# Patient Record
Sex: Male | Born: 1955 | Race: White | Hispanic: No | Smoking: Never smoker
Health system: Southern US, Community
[De-identification: ages and names within clinical notes are randomized; demographics above are authoritative.]

## PROBLEM LIST (undated history)

## (undated) DIAGNOSIS — E663 Overweight: Secondary | ICD-10-CM

## (undated) DIAGNOSIS — I491 Atrial premature depolarization: Secondary | ICD-10-CM

## (undated) DIAGNOSIS — G609 Hereditary and idiopathic neuropathy, unspecified: Secondary | ICD-10-CM

## (undated) DIAGNOSIS — I10 Essential (primary) hypertension: Secondary | ICD-10-CM

## (undated) DIAGNOSIS — R7309 Other abnormal glucose: Secondary | ICD-10-CM

## (undated) DIAGNOSIS — E785 Hyperlipidemia, unspecified: Secondary | ICD-10-CM

## (undated) DIAGNOSIS — K5790 Diverticulosis of intestine, part unspecified, without perforation or abscess without bleeding: Secondary | ICD-10-CM

## (undated) DIAGNOSIS — Z87442 Personal history of urinary calculi: Secondary | ICD-10-CM

## (undated) DIAGNOSIS — G43009 Migraine without aura, not intractable, without status migrainosus: Secondary | ICD-10-CM

## (undated) DIAGNOSIS — E119 Type 2 diabetes mellitus without complications: Secondary | ICD-10-CM

## (undated) HISTORY — DX: Atrial premature depolarization: I49.1

## (undated) HISTORY — PX: TRACHEOSTOMY: SUR1362

## (undated) HISTORY — DX: Overweight: E66.3

## (undated) HISTORY — DX: Hereditary and idiopathic neuropathy, unspecified: G60.9

## (undated) HISTORY — DX: Hyperlipidemia, unspecified: E78.5

## (undated) HISTORY — DX: Personal history of urinary calculi: Z87.442

## (undated) HISTORY — DX: Other abnormal glucose: R73.09

## (undated) HISTORY — DX: Diverticulosis of intestine, part unspecified, without perforation or abscess without bleeding: K57.90

## (undated) HISTORY — DX: Essential (primary) hypertension: I10

## (undated) HISTORY — DX: Migraine without aura, not intractable, without status migrainosus: G43.009

## (undated) HISTORY — PX: TONSILLECTOMY: SUR1361

---

## 2002-05-28 ENCOUNTER — Emergency Department (HOSPITAL_COMMUNITY): Admission: EM | Admit: 2002-05-28 | Discharge: 2002-05-29 | Payer: Self-pay | Admitting: Emergency Medicine

## 2002-05-29 ENCOUNTER — Encounter: Payer: Self-pay | Admitting: Emergency Medicine

## 2004-04-06 ENCOUNTER — Ambulatory Visit: Payer: Self-pay | Admitting: Internal Medicine

## 2004-04-12 ENCOUNTER — Ambulatory Visit: Payer: Self-pay

## 2004-08-10 ENCOUNTER — Ambulatory Visit: Payer: Self-pay | Admitting: Internal Medicine

## 2004-09-18 ENCOUNTER — Ambulatory Visit: Payer: Self-pay | Admitting: Internal Medicine

## 2005-01-28 LAB — HM COLONOSCOPY

## 2005-09-23 ENCOUNTER — Ambulatory Visit: Payer: Self-pay | Admitting: Internal Medicine

## 2005-11-04 ENCOUNTER — Ambulatory Visit: Payer: Self-pay | Admitting: Internal Medicine

## 2006-07-18 ENCOUNTER — Ambulatory Visit: Payer: Self-pay | Admitting: Gastroenterology

## 2006-08-10 ENCOUNTER — Encounter: Payer: Self-pay | Admitting: Internal Medicine

## 2006-08-28 ENCOUNTER — Ambulatory Visit: Payer: Self-pay | Admitting: Gastroenterology

## 2006-11-04 ENCOUNTER — Ambulatory Visit: Payer: Self-pay | Admitting: Internal Medicine

## 2006-11-04 LAB — CONVERTED CEMR LAB
ALT: 21 units/L (ref 0–53)
AST: 23 units/L (ref 0–37)
Albumin: 3.9 g/dL (ref 3.5–5.2)
Alkaline Phosphatase: 77 units/L (ref 39–117)
BUN: 17 mg/dL (ref 6–23)
Basophils Absolute: 0 10*3/uL (ref 0.0–0.1)
Basophils Relative: 0.4 % (ref 0.0–1.0)
Bilirubin Urine: NEGATIVE
Bilirubin, Direct: 0.1 mg/dL (ref 0.0–0.3)
Blood in Urine, dipstick: NEGATIVE
CO2: 31 meq/L (ref 19–32)
Calcium: 9.5 mg/dL (ref 8.4–10.5)
Chloride: 105 meq/L (ref 96–112)
Cholesterol: 191 mg/dL (ref 0–200)
Creatinine, Ser: 1.1 mg/dL (ref 0.4–1.5)
Eosinophils Absolute: 0.2 10*3/uL (ref 0.0–0.6)
Eosinophils Relative: 2.1 % (ref 0.0–5.0)
GFR calc Af Amer: 91 mL/min
GFR calc non Af Amer: 75 mL/min
Glucose, Bld: 105 mg/dL — ABNORMAL HIGH (ref 70–99)
Glucose, Urine, Semiquant: NEGATIVE
HCT: 42.3 % (ref 39.0–52.0)
HDL: 34.1 mg/dL — ABNORMAL LOW (ref >39.0)
Hemoglobin: 14.7 g/dL (ref 13.0–17.0)
Ketones, urine, test strip: NEGATIVE
LDL Cholesterol: 116 mg/dL — ABNORMAL HIGH (ref 0–99)
Lymphocytes Relative: 34.3 % (ref 12.0–46.0)
MCHC: 34.8 g/dL (ref 30.0–36.0)
MCV: 91.8 fL (ref 78.0–100.0)
Monocytes Absolute: 0.9 10*3/uL — ABNORMAL HIGH (ref 0.2–0.7)
Monocytes Relative: 10.9 % (ref 3.0–11.0)
Neutro Abs: 4.2 10*3/uL (ref 1.4–7.7)
Neutrophils Relative %: 52.3 % (ref 43.0–77.0)
Nitrite: NEGATIVE
PSA: 0.65 ng/mL (ref 0.10–4.00)
Platelets: 268 10*3/uL (ref 150–400)
Potassium: 4.6 meq/L (ref 3.5–5.1)
Protein, U semiquant: NEGATIVE
RBC: 4.6 M/uL (ref 4.22–5.81)
RDW: 13.3 % (ref 11.5–14.6)
Sodium: 141 meq/L (ref 135–145)
Specific Gravity, Urine: 1.02
TSH: 2.17 microintl units/mL (ref 0.35–5.50)
Total Bilirubin: 0.9 mg/dL (ref 0.3–1.2)
Total CHOL/HDL Ratio: 5.6
Total Protein: 6.7 g/dL (ref 6.0–8.3)
Triglycerides: 205 mg/dL (ref 0–149)
Urobilinogen, UA: 0.2
VLDL: 41 mg/dL — ABNORMAL HIGH (ref 0–40)
WBC: 8.1 10*3/uL (ref 4.5–10.5)
pH: 5

## 2006-11-11 ENCOUNTER — Ambulatory Visit: Payer: Self-pay | Admitting: Internal Medicine

## 2006-11-11 DIAGNOSIS — R7309 Other abnormal glucose: Secondary | ICD-10-CM

## 2006-11-11 DIAGNOSIS — Z87442 Personal history of urinary calculi: Secondary | ICD-10-CM

## 2006-11-11 DIAGNOSIS — M109 Gout, unspecified: Secondary | ICD-10-CM | POA: Insufficient documentation

## 2006-11-11 HISTORY — DX: Personal history of urinary calculi: Z87.442

## 2006-11-11 HISTORY — DX: Other abnormal glucose: R73.09

## 2006-11-26 ENCOUNTER — Encounter: Payer: Self-pay | Admitting: Internal Medicine

## 2006-11-26 ENCOUNTER — Ambulatory Visit: Payer: Self-pay

## 2006-11-28 ENCOUNTER — Ambulatory Visit: Payer: Self-pay

## 2006-12-04 ENCOUNTER — Telehealth (INDEPENDENT_AMBULATORY_CARE_PROVIDER_SITE_OTHER): Payer: Self-pay | Admitting: *Deleted

## 2006-12-12 ENCOUNTER — Encounter: Payer: Self-pay | Admitting: Cardiovascular Disease

## 2006-12-12 ENCOUNTER — Ambulatory Visit: Payer: Self-pay | Admitting: Cardiovascular Disease

## 2006-12-15 ENCOUNTER — Ambulatory Visit: Payer: Self-pay

## 2007-02-13 ENCOUNTER — Telehealth: Payer: Self-pay | Admitting: Internal Medicine

## 2008-04-27 ENCOUNTER — Ambulatory Visit: Payer: Self-pay | Admitting: Internal Medicine

## 2009-02-20 ENCOUNTER — Ambulatory Visit: Payer: Self-pay | Admitting: Internal Medicine

## 2009-02-20 LAB — CONVERTED CEMR LAB
Alkaline Phosphatase: 62 units/L (ref 39–117)
BUN: 23 mg/dL (ref 6–23)
Basophils Absolute: 0.1 10*3/uL (ref 0.0–0.1)
Bilirubin Urine: NEGATIVE
Bilirubin, Direct: 0 mg/dL (ref 0.0–0.3)
Blood in Urine, dipstick: NEGATIVE
CO2: 29 meq/L (ref 19–32)
Calcium: 9.1 mg/dL (ref 8.4–10.5)
Chloride: 108 meq/L (ref 96–112)
Cholesterol: 175 mg/dL (ref 0–200)
Creatinine, Ser: 1.3 mg/dL (ref 0.4–1.5)
Eosinophils Absolute: 0.3 10*3/uL (ref 0.0–0.7)
Glucose, Bld: 119 mg/dL — ABNORMAL HIGH (ref 70–99)
HDL: 39.3 mg/dL (ref >39.00)
Ketones, urine, test strip: NEGATIVE
Lymphocytes Relative: 31.5 % (ref 12.0–46.0)
MCHC: 32.2 g/dL (ref 30.0–36.0)
MCV: 95.3 fL (ref 78.0–100.0)
Monocytes Absolute: 0.9 10*3/uL (ref 0.1–1.0)
Neutrophils Relative %: 50.4 % (ref 43.0–77.0)
Nitrite: NEGATIVE
PSA: 0.77 ng/mL (ref 0.10–4.00)
Platelets: 221 10*3/uL (ref 150.0–400.0)
RDW: 13.2 % (ref 11.5–14.6)
Specific Gravity, Urine: 1.02
Total CHOL/HDL Ratio: 4
Total Protein: 7 g/dL (ref 6.0–8.3)
Triglycerides: 119 mg/dL (ref 0.0–149.0)
pH: 6

## 2009-02-27 ENCOUNTER — Ambulatory Visit: Payer: Self-pay | Admitting: Internal Medicine

## 2009-02-27 DIAGNOSIS — G43009 Migraine without aura, not intractable, without status migrainosus: Secondary | ICD-10-CM | POA: Insufficient documentation

## 2009-02-27 HISTORY — DX: Migraine without aura, not intractable, without status migrainosus: G43.009

## 2009-07-03 ENCOUNTER — Ambulatory Visit: Payer: Self-pay | Admitting: Internal Medicine

## 2009-07-03 LAB — CONVERTED CEMR LAB
ALT: 22 units/L (ref 0–53)
BUN: 22 mg/dL (ref 6–23)
Bilirubin, Direct: 0.1 mg/dL (ref 0.0–0.3)
CO2: 30 meq/L (ref 19–32)
Calcium: 9.5 mg/dL (ref 8.4–10.5)
Chloride: 105 meq/L (ref 96–112)
Cholesterol: 194 mg/dL (ref 0–200)
Creatinine, Ser: 1.1 mg/dL (ref 0.4–1.5)
Direct LDL: 121.5 mg/dL
Hgb A1c MFr Bld: 6.1 % (ref 4.6–6.5)
Total CHOL/HDL Ratio: 5
Total Protein: 7 g/dL (ref 6.0–8.3)
Triglycerides: 212 mg/dL — ABNORMAL HIGH (ref 0.0–149.0)
VLDL: 42.4 mg/dL — ABNORMAL HIGH (ref 0.0–40.0)

## 2009-07-21 ENCOUNTER — Telehealth: Payer: Self-pay | Admitting: Internal Medicine

## 2009-10-30 ENCOUNTER — Telehealth: Payer: Self-pay | Admitting: Internal Medicine

## 2009-11-01 ENCOUNTER — Telehealth: Payer: Self-pay | Admitting: Internal Medicine

## 2009-11-10 ENCOUNTER — Ambulatory Visit: Payer: Self-pay | Admitting: Internal Medicine

## 2009-11-10 DIAGNOSIS — G609 Hereditary and idiopathic neuropathy, unspecified: Secondary | ICD-10-CM | POA: Insufficient documentation

## 2009-11-10 HISTORY — DX: Hereditary and idiopathic neuropathy, unspecified: G60.9

## 2009-11-14 LAB — CONVERTED CEMR LAB
BUN: 17 mg/dL (ref 6–23)
CO2: 26 meq/L (ref 19–32)
Calcium: 9.8 mg/dL (ref 8.4–10.5)
Creatinine, Ser: 1 mg/dL (ref 0.4–1.5)
GFR calc non Af Amer: 79.97 mL/min (ref >60)
Glucose, Bld: 100 mg/dL — ABNORMAL HIGH (ref 70–99)
TSH: 2.79 microintl units/mL (ref 0.35–5.50)

## 2009-11-16 ENCOUNTER — Telehealth: Payer: Self-pay | Admitting: Internal Medicine

## 2009-11-27 ENCOUNTER — Encounter: Payer: Self-pay | Admitting: Internal Medicine

## 2009-12-05 ENCOUNTER — Encounter: Payer: Self-pay | Admitting: Internal Medicine

## 2009-12-26 ENCOUNTER — Telehealth: Payer: Self-pay | Admitting: Internal Medicine

## 2010-01-16 ENCOUNTER — Encounter: Payer: Self-pay | Admitting: Internal Medicine

## 2010-02-27 NOTE — Consult Note (Signed)
Summary: [facility]  [facility]   Imported By: Maryln Gottron 12/08/2009 14:43:11  _____________________________________________________________________  External Attachment:    Type:   Image     Comment:   External Document

## 2010-02-27 NOTE — Progress Notes (Signed)
Summary: Urology Referral  Phone Note Call from Patient Call back at Home Phone [phone] Call back at Work Phone [phone] Call back at [phone]   Summary of Call: Pt was referred to urologist several years ago for frequent kidney stones.  Has had 2 within the last month, has been self treating but really needs to see urologist, requesting referral. Initial call taken by: Trixie Dredge,  October 30, 2009 11:52 AM  Follow-up for Phone Call        ok Follow-up by: Birdie Sons MD,  October 30, 2009 1:51 PM  Additional Follow-up for Phone Call Additional follow up Details #1::        referral order sent to [facility]. Additional Follow-up by: Trixie Dredge,  October 30, 2009 3:14 PM

## 2010-02-27 NOTE — Assessment & Plan Note (Signed)
Summary: numbness underneath toes for a couple of months/cjr   Vital Signs:  Patient profile:   55 year old male Height:      75.5 inches (191.77 cm) Weight:      322 pounds (146.36 kg) Temp:     98.5 degrees F (36.94 degrees C) oral Pulse rate:   90 / minute BP sitting:   120 / 90  (left arm) Cuff size:   large  Vitals Entered By: Josph Macho RMA (November 10, 2009 8:58 AM) CC: Numbness underneath toes for a couple of weeks/ CF Is Patient Diabetic? No   CC:  Numbness underneath toes for a couple of weeks/ CF.  History of Present Illness: 2 month hx of unusual feeling feet bilaterally feels like "socks bunched up" "loss of sensation" no pain no associated sxs  no other specific complaints  Current Medications (verified): 1)  Allopurinol 300 Mg Tabs (Allopurinol) .... Take 1 Tablet By Mouth Once A Day 2)  Multivitamins  Tabs (Multiple Vitamin) .... Once Daily  Allergies (verified): No Known Drug Allergies  Review of Systems       Flu Vaccine Consent Questions     Do you have a history of severe allergic reactions to this vaccine? no    Any prior history of allergic reactions to egg and/or gelatin? no    Do you have a sensitivity to the preservative Thimersol? no    Do you have a past history of Guillan-Barre Syndrome? no    Do you currently have an acute febrile illness? no    Have you ever had a severe reaction to latex? no    Vaccine information given and explained to patient? yes    Are you currently pregnant? no    Lot Number:[id number]   Exp Date:07/28/2010   Site Given  Left Deltoid IM Josph Macho RMA  November 10, 2009 9:00 AM   Physical Exam  General:  overweight male in no acute distress. HEENT exam atraumatic, normocephalic. Neck is supple without lymphadenopathy or thyromegaly. Chest clear to auscultation cardiac exam S1-S2 are regular. Abdominal exam active bowel sounds, soft. Extremities without clubbing cyanosis or edema. Neurologic exam gait  is normal. Deep tendon reflexes are intact. Monofilament testing is normal over the feet and ankle and hands bilaterally.   Impression & Recommendations:  Problem # 1:  PERIPHERAL NEUROPATHY (ICD-356.9) by history. Unable to find any objective evidence of such. I'll check some routine laboratories. If these are normal and not sure if further workup is indicated. I did tell the patient if he becomes overly concerned I nerve conduction study may be diagnostic. Orders: Venipuncture (25427) TLB-TSH (Thyroid Stimulating Hormone) (84443-TSH) TLB-B12 + Folate Pnl (06237_62831-D17/OHY) TLB-BMP (Basic Metabolic Panel-BMET) (80048-METABOL) TLB-A1C / Hgb A1C (Glycohemoglobin) (83036-A1C)  Complete Medication List: 1)  Allopurinol 300 Mg Tabs (Allopurinol) .... Take 1 tablet by mouth once a day 2)  Multivitamins Tabs (Multiple vitamin) .... Once daily  Other Orders: Admin 1st Vaccine (07371) Flu Vaccine 56yrs + [phone])

## 2010-02-27 NOTE — Progress Notes (Signed)
Summary: nerve conduction  Phone Note Call from Patient Call back at Home Phone [phone]   Caller: Patient Call For: Birdie Sons MD Summary of Call: Needs referral from [place] for neurology.  Also, need nerve conduction study results. Initial call taken by: [facility] CMA AAMA,  December 26, 2009 12:06 PM  Follow-up for Phone Call        ok to refer Follow-up by: Birdie Sons MD,  December 26, 2009 3:05 PM  Additional Follow-up for Phone Call Additional follow up Details #1::        Notified pt. Additional Follow-up by: Lynann Beaver CMA AAMA,  December 26, 2009 3:13 PM

## 2010-02-27 NOTE — Progress Notes (Signed)
Summary: yeast infection?  Phone Note Call from Patient   Caller: Patient Call For: Birdie Sons MD Summary of Call: Pt. states he has a yeast infection in the groin area, and was treated by Dr. Cato Mulligan before with a powder and an ointment.  [facility]).  [phone] Initial call taken by: Lynann Beaver CMA,  November 01, 2009 11:42 AM  Follow-up for Phone Call        see rx Follow-up by: Birdie Sons MD,  November 01, 2009 12:34 PM  Additional Follow-up for Phone Call Additional follow up Details #1::        Left message on personal voice mail to pick up prescriptions. Additional Follow-up by: Lynann Beaver CMA,  November 01, 2009 12:37 PM    New/Updated Medications: NYSTATIN-TRIAMCINOLONE 100000-0.1 UNIT/GM-% CREA (NYSTATIN-TRIAMCINOLONE) apply two times a day to affected area Prescriptions: NYSTATIN-TRIAMCINOLONE 100000-0.1 UNIT/GM-% CREA (NYSTATIN-TRIAMCINOLONE) apply two times a day to affected area  #30 grams x 0   Entered and Authorized by:   Birdie Sons MD   Signed by:   Birdie Sons MD on 11/01/2009   Method used:   Electronically to        [facility] (retail)       [address]       [place], [place]  [address]       Ph: [phone]       Fax: [phone]   RxID:   [id number]

## 2010-02-27 NOTE — Progress Notes (Signed)
  Phone Note Call from Patient Call back at Home Phone [phone] Call back at Work Phone [phone]   Summary of Call: [phone] Nicholas Macdonald what would be the next step in diagnosis of his feet numbness. Initial call taken by: Lynann Beaver CMA AAMA,  November 16, 2009 3:36 PM  Follow-up for Phone Call        next step is nerve conduction study---would document neuropathy ok to schedule Follow-up by: Birdie Sons MD,  November 16, 2009 10:09 PM

## 2010-02-27 NOTE — Procedures (Signed)
Summary: Colonoscopy  Colonoscopy   Imported By: Cala Bradford Mesiemore 02/27/2009 11:14:21  _____________________________________________________________________  External Attachment:    Type:   Image     Comment:   External Document

## 2010-02-27 NOTE — Progress Notes (Signed)
Summary: lab results  Phone Note Call from Patient Call back at [phone]   Caller: Patient--live call Summary of Call: Reqesting lab results from a few weeks ago. Initial call taken by: Warnell Forester,  July 21, 2009 9:45 AM  Follow-up for Phone Call        Left results on personal voice mail. Follow-up by: Gladis Riffle, RN,  July 21, 2009 11:58 AM

## 2010-02-27 NOTE — Assessment & Plan Note (Signed)
Summary: CPX // RS   Vital Signs:  Patient profile:   55 year old male Height:      75.5 inches Weight:      323 pounds BMI:     39.98 Pulse rate:   86 / minute Resp:     12 per minute BP sitting:   120 / 86  (left arm) Cuff size:   large  Vitals Entered By: Gladis Riffle, RN (February 27, 2009 10:07 AM) CC: cpx, labs done Is Patient Diabetic? No   CC:  cpx and labs done.  History of Present Illness: cpx  new complaints: pt describes moderate lack of sensation toes of left foot  migraine headache with typical visual aura. ongoing and unchanged for years. frequency --approx monthly, intensit 4-7/10.  no other associated sxs  Preventive Screening-Counseling & Management  Alcohol-Tobacco     Smoking Status: never  Current Problems (verified): 1)  Hyperglycemia  (ICD-790.29) 2)  Preventive Health Care  (ICD-V70.0) 3)  Nephrolithiasis, Hx of  (ICD-V13.01) 4)  Gout  (ICD-274.9)  Current Medications (verified): 1)  Allopurinol 300 Mg Tabs (Allopurinol) .... Take 1 Tablet By Mouth Once A Day 2)  Multivitamins  Tabs (Multiple Vitamin) .... Once Daily  Allergies (verified): No Known Drug Allergies  Comments:  Nurse/Medical Assistant: cpx, labs done--needs Rx   The patient's medications and allergies were reviewed with the patient and were updated in the Medication and Allergy Lists. Gladis Riffle, RN (February 27, 2009 10:08 AM)  Past History:  Past Medical History: Last updated: 11/11/2006 Gout Nephrolithiasis, hx of [facility]-dr Lupton. 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  normal blood pressure Head:  normocephalic and atraumatic.   Eyes:  pupils equal and pupils round.   Ears:  R ear normal and L ear normal.   Nose:  no external deformity and no external erythema.   Neck:  No deformities, masses, or tenderness noted. Chest Wall:  No deformities, masses, tenderness or gynecomastia noted. Lungs:  Normal respiratory effort, chest expands symmetrically. Lungs are clear to auscultation, no crackles or wheezes. Heart:  normal rate and regular rhythm.   Abdomen:  soft and non-tender.    overweight Rectal:  no external abnormalities and no hemorrhoids.   Prostate:  no nodules and no asymmetry.   Msk:  No deformity or scoliosis noted of thoracic or lumbar spine.   Pulses:  R radial normal and L radial normal.   Neurologic:  cranial nerves II-XII intact and gait normal.   Skin:  turgor normal and color normal.   Cervical Nodes:  no anterior cervical adenopathy and no posterior cervical adenopathy.   Axillary Nodes:  no R axillary adenopathy and no L axillary adenopathy.   Psych:  normally interactive and good eye contact.     Impression & Recommendations:  Problem # 1:  PREVENTIVE HEALTH CARE (ICD-V70.0) health maint utd disccussed need for aggressive weight loss daily exercise and low calorie diet  Problem # 2:  GOUT (ICD-274.9)  no recurrence His updated medication list for this problem includes:    Allopurinol 300 Mg Tabs (Allopurinol) .Marland Kitchen... Take 1 tablet by mouth once a day  Problem # 3:  MIGRAINE, COMMON (ICD-346.10) this is a long standing problem but has nto been addressed with triptan---discussed trial of as needed meds side effects discussed His updated medication list for this problem includes:    Sumatriptan Succinate 100 Mg Tabs  (Sumatriptan succinate) .Marland Kitchen... 1 by mouth at onset of headache. may repeat in one hour  Complete Medication List: 1)  Allopurinol 300 Mg Tabs (Allopurinol) .... Take 1 tablet by mouth once a day 2)  Multivitamins Tabs (Multiple vitamin) .... Once daily 3)  Sumatriptan Succinate 100 Mg Tabs (Sumatriptan succinate) .Marland Kitchen.. 1 by mouth at onset of headache. may repeat in one hour  Other Orders: Tdap => 62yrs IM (25366) Admin 1st Vaccine (44034)  Patient Instructions: 1)  4 months labs only 2)  labs one week prior to visit 3)  lipids---272.4 4)  lfts-995.2 5)  bmet-995.2 6)  A1C-250.02 7)     Prescriptions: ALLOPURINOL 300 MG TABS (ALLOPURINOL) Take 1 tablet by mouth once a day  #180 Each x 1   Entered and Authorized by:   Birdie Sons MD   Signed by:   Birdie Sons MD on 02/27/2009   Method used:   Electronically to        [facility] (retail)       [address]       [place], [place]  [address]       Ph: [phone]       Fax: [phone]   RxID:   [id number] SUMATRIPTAN SUCCINATE 100 MG TABS (SUMATRIPTAN SUCCINATE) 1 by mouth at onset of headache. may repeat in one hour  #9 x 3   Entered and Authorized by:   Birdie Sons MD   Signed by:   Birdie Sons MD on 02/27/2009   Method used:   Electronically to        [facility] (retail)       [address]       [place], [place]  [address]       Ph: [phone]       Fax: [phone]   RxID:   [id number]    Immunization History:  Influenza Immunization History:    Influenza:  historical (02/07/2009)  Immunizations Administered:  Tetanus Vaccine:    Vaccine Type: Tdap    Site: left deltoid    Mfr: GlaxoSmithKline    Dose: 0.5 ml    Route: IM    Given by: Gladis Riffle, RN    Exp. Date: 11/23/2009    Lot #: [id number]    Preventive Care Screening  Colonoscopy:    Next Due:  01/2015

## 2010-03-01 NOTE — Letter (Signed)
Summary: [facility]  [facility]   Imported By: Maryln Gottron 01/24/2010 09:40:43  _____________________________________________________________________  External Attachment:    Type:   Image     Comment:   External Document

## 2010-03-08 ENCOUNTER — Telehealth: Payer: Self-pay | Admitting: Internal Medicine

## 2010-03-08 DIAGNOSIS — M109 Gout, unspecified: Secondary | ICD-10-CM

## 2010-03-08 MED ORDER — ALLOPURINOL 300 MG PO TABS: 300.0000 mg | ORAL_TABLET | Freq: Every day | ORAL | Status: DC

## 2010-03-08 NOTE — Telephone Encounter (Signed)
Needs rx refill of Allopurinol 300MG  set to [facility] on [address] [phone]

## 2010-03-12 ENCOUNTER — Telehealth: Payer: Self-pay | Admitting: Internal Medicine

## 2010-03-12 DIAGNOSIS — M109 Gout, unspecified: Secondary | ICD-10-CM

## 2010-03-12 MED ORDER — ALLOPURINOL 300 MG PO TABS: 300.0000 mg | ORAL_TABLET | Freq: Every day | ORAL | Status: DC

## 2010-03-12 NOTE — Telephone Encounter (Signed)
Pt called and has sch a fasting cpx with labs on 04/06/10 at 8am. Pt is req his full refill for Allopurinol to [facility] on [address].

## 2010-03-27 ENCOUNTER — Encounter: Payer: Self-pay | Admitting: Internal Medicine

## 2010-03-27 ENCOUNTER — Ambulatory Visit (INDEPENDENT_AMBULATORY_CARE_PROVIDER_SITE_OTHER): Payer: BC Managed Care – PPO | Admitting: Internal Medicine

## 2010-03-27 VITALS — BP 124/84 | HR 80 | Temp 99.2°F | Ht 76.0 in | Wt 321.0 lb

## 2010-03-27 DIAGNOSIS — G609 Hereditary and idiopathic neuropathy, unspecified: Secondary | ICD-10-CM

## 2010-03-27 DIAGNOSIS — M109 Gout, unspecified: Secondary | ICD-10-CM

## 2010-03-27 MED ORDER — ALLOPURINOL 300 MG PO TABS: 300.0000 mg | ORAL_TABLET | Freq: Every day | ORAL | Status: DC

## 2010-03-27 NOTE — Progress Notes (Signed)
  Subjective:    Patient ID: Nicholas Macdonald, male    DOB: 05/11/1955, 55 y.o.   MRN: [id number]  HPI   patient comes in for gout. He has not had any recurrence over the past several years while on allopurinol. He has never tried to discontinue the allopurinol. He feels well has no complaints. No known side effects.  Past Medical History  Diagnosis Date  . Gout   . NEPHROLITHIASIS, HX OF 11/11/2006  . HYPERGLYCEMIA 11/11/2006  . PERIPHERAL NEUROPATHY 11/10/2009  . MIGRAINE, COMMON 02/27/2009   Past Surgical History  Procedure Date  . Tonsillectomy   . Tracheostomy     croup    reports that he has never smoked. He does not have any smokeless tobacco history on file. His alcohol and drug histories not on file. family history includes Atrial fibrillation in his father.     No Known Allergies   Review of Systems  patient denies chest pain, shortness of breath, orthopnea. Denies lower extremity edema, abdominal pain, change in appetite, change in bowel movements. Patient denies rashes, musculoskeletal complaints. No other specific complaints in a complete review of systems.      Objective:   Physical Exam  well-developed well-nourished male in no acute distress. HEENT exam atraumatic, normocephalic, neck supple without jugular venous distention. Chest clear to auscultation cardiac exam S1-S2 are regular. Abdominal exam overweight with bowel sounds, soft and nontender. Extremities no edema. Neurologic exam is alert with a normal gait.        Assessment & Plan:

## 2010-03-27 NOTE — Assessment & Plan Note (Signed)
Currents. Reasonable to continue allopurinol. I will refill the prescription. I did discuss with him the possibility that he could decreased Present dose of allopurinol. He will consider decreasing to every other day or half a pill qd

## 2010-04-06 ENCOUNTER — Encounter: Payer: Self-pay | Admitting: Internal Medicine

## 2010-06-12 NOTE — Assessment & Plan Note (Signed)
[facility]                            CARDIOLOGY OFFICE NOTE   NAME:MAHONEYMarland Kitchen SHOLOM DULUDE                    MRN:          [id number]  DATE:12/12/2006                            DOB:          03/29/1955    Mr. Stoiber is seen today as a new consult per Dr. Cato Mulligan.   He has had an abnormal Myoview.  He has been having sensations in his  chest.  In talking to the patient, he gets a surge in his chest that  has been going on for a year.  When he saw Dr. Cato Mulligan for his physical,  he had them for 3 days in a row.  The symptoms are atypical.  They do  not sound like angina.  They actually sound more like he is getting an  occasional flip flop or palpitation.  They sound like benign PACs or  PVCs.  He does not have long runs of palpitations.  These surges are  really not associated with dyspnea.  They can give him a sensation of  pressure in his chest.  There is no associated GI symptoms, no belching.  The patient has not had any vertigo or dizziness.  He has not had a  previous cardiac workup before.   He had a Myoview.  He exercised for 8 minutes and stopped due to  fatigue.  There were no significant EKG changes.  His Myoview showed no  evidence of ischemia but there was a question of abnormal septal motion  and the EF was quantitated at 43%.  I have reviewed his study and felt  it was low risk and I suspect that the EF is wrong.  I discussed this  with the patient.   REVIEW OF SYSTEMS:  Otherwise negative.   PAST MEDICAL HISTORY:  Fairly benign.  1. He has had a previous tracheostomy as a child for croup.  2. He has a question of gout.  3. He denies any allergies.   MEDICATIONS:  Multivitamin and occasionally he takes allopurinol.   FAMILY HISTORY:  Negative for premature coronary disease.   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  He travels  quite a bit, more by car now.  He does not smoke or drink.  He has 2  teenage boys and  is divorced.  He is fairly sedentary.   PHYSICAL EXAMINATION:  GENERAL:  Significant for an overweight white  male in no distress.  VITAL SIGNS:  Weight is 307, affect is appropriate, blood pressure is  118/78, pulse is 80 and regular, afebrile.  HEENT:  Unremarkable.  Carotids normal without bruit.  No  lymphadenopathy, thyromegaly, or JVP elevation.  LUNGS:  Clear with good diaphragmatic motion.  No wheezing.  HEART:  S1 S2 with normal heart sounds.  PMI is not palpable.  ABDOMEN:  Benign.  Bowel sounds positive.  No hepatosplenomegaly,  hepatojugular reflux.  No tenderness.  No bruits.  EXTREMITIES:  Distal pulses are intact.  No edema.  NEUROLOGIC:  Nonfocal.  SKIN:  Warm and dry.   His baseline EKG is normal.   IMPRESSION:  1. Episode of surges.  These sound like benign palpitations.  They      are currently more quiescent.  I told him if they were to recur to      call us and we would give him an event monitor.  Observation for      now.  2. Abnormal Myoview with septal abnormality.  Ejection fraction 43%.      Again, his perfusion images look normal and he had no EKG changes      with exercise.  We will do a 2D echocardiogram.  If his      echocardiogram is normal, he will not need further workup.  If his      ejection fraction is depressed, he will need further workup for a      possible nonischemic cardiomyopathy.  3. Risk factor modification per Dr. Cato Mulligan.  His blood pressure seems      to be under control.  He needs a lipid profile, which I will leave      up to Dr. Cato Mulligan to follow up.  4. History of tracheostomy, currently no wheezing, secondary to croup.      No evidence of active lung disease as an adult.  No need for a flu      shot at this time.  5. History of gout.  Continue allopurinol.  Follow up uric acid level.   Overall, it was a pleasure to meet Mr. Collard and as long as his  echocardiogram shows good LV function, he will follow up p.r.n.     Noralyn Pick. Eden Emms, MD, [facility]  Electronically Signed    PCN/MedQ  DD: 12/12/2006  DT: 12/12/2006  Job #: [id number]

## 2010-11-05 ENCOUNTER — Other Ambulatory Visit (INDEPENDENT_AMBULATORY_CARE_PROVIDER_SITE_OTHER): Payer: BC Managed Care – PPO

## 2010-11-05 DIAGNOSIS — Z23 Encounter for immunization: Secondary | ICD-10-CM

## 2010-11-05 DIAGNOSIS — Z0000 Encounter for general adult medical examination without abnormal findings: Secondary | ICD-10-CM

## 2010-11-05 LAB — HEPATIC FUNCTION PANEL
ALT: 23 U/L (ref 0–53)
AST: 28 U/L (ref 0–37)
Albumin: 3.9 g/dL (ref 3.5–5.2)
Alkaline Phosphatase: 72 U/L (ref 39–117)
Bilirubin, Direct: 0.1 mg/dL (ref 0.0–0.3)
Total Bilirubin: 0.6 mg/dL (ref 0.3–1.2)
Total Protein: 6.8 g/dL (ref 6.0–8.3)

## 2010-11-05 LAB — POCT URINALYSIS DIPSTICK
Glucose, UA: NEGATIVE
Nitrite, UA: NEGATIVE
Protein, UA: NEGATIVE
Urobilinogen, UA: 0.2
pH, UA: 5.5

## 2010-11-05 LAB — CBC WITH DIFFERENTIAL/PLATELET
Basophils Relative: 0.5 % (ref 0.0–3.0)
Eosinophils Relative: 2.6 % (ref 0.0–5.0)
HCT: 44.2 % (ref 39.0–52.0)
Lymphs Abs: 2.1 10*3/uL (ref 0.7–4.0)
MCV: 93.3 fl (ref 78.0–100.0)
Monocytes Absolute: 0.6 10*3/uL (ref 0.1–1.0)
Monocytes Relative: 9 % (ref 3.0–12.0)
RBC: 4.74 Mil/uL (ref 4.22–5.81)
WBC: 6.9 10*3/uL (ref 4.5–10.5)

## 2010-11-05 LAB — BASIC METABOLIC PANEL
Chloride: 106 mEq/L (ref 96–112)
GFR: 82.45 mL/min (ref >60.00)
Potassium: 4.5 mEq/L (ref 3.5–5.1)
Sodium: 140 mEq/L (ref 135–145)

## 2010-11-05 LAB — TSH: TSH: 2.07 u[IU]/mL (ref 0.35–5.50)

## 2010-11-05 LAB — PSA: PSA: 0.53 ng/mL (ref 0.10–4.00)

## 2010-11-05 LAB — LIPID PANEL
HDL: 40.4 mg/dL (ref >39.00)
Triglycerides: 151 mg/dL — ABNORMAL HIGH (ref 0.0–149.0)
VLDL: 30.2 mg/dL (ref 0.0–40.0)

## 2010-11-08 ENCOUNTER — Other Ambulatory Visit: Payer: Self-pay | Admitting: Internal Medicine

## 2010-11-08 NOTE — Telephone Encounter (Signed)
Pt had to resch cpx to 12-25-2010 due to doc unavailable. Pt urine had blood in it when he had cpx labs. Pls advise

## 2010-11-08 NOTE — Telephone Encounter (Signed)
Ok to refill meds Check UA with culture---we will call back with advice

## 2010-11-09 NOTE — Telephone Encounter (Signed)
What antibiotic do you want called in?

## 2010-11-12 ENCOUNTER — Encounter: Payer: BC Managed Care – PPO | Admitting: Internal Medicine

## 2010-11-12 MED ORDER — CIPROFLOXACIN HCL 500 MG PO TABS: 500.0000 mg | ORAL_TABLET | Freq: Two times a day (BID) | ORAL | Status: AC

## 2010-11-12 MED ORDER — CIPROFLOXACIN HCL 500 MG PO TABS: 500.0000 mg | ORAL_TABLET | Freq: Two times a day (BID) | ORAL | Status: DC

## 2010-11-12 NOTE — Telephone Encounter (Signed)
Notified pt. 

## 2010-11-12 NOTE — Telephone Encounter (Signed)
Try cipro 500 mg po bid #20/0 refills

## 2010-12-25 ENCOUNTER — Ambulatory Visit (INDEPENDENT_AMBULATORY_CARE_PROVIDER_SITE_OTHER): Payer: BC Managed Care – PPO | Admitting: Internal Medicine

## 2010-12-25 ENCOUNTER — Encounter: Payer: Self-pay | Admitting: Internal Medicine

## 2010-12-25 VITALS — BP 126/82 | HR 84 | Temp 98.4°F | Ht 76.0 in | Wt 324.0 lb

## 2010-12-25 DIAGNOSIS — Z0000 Encounter for general adult medical examination without abnormal findings: Secondary | ICD-10-CM

## 2010-12-25 DIAGNOSIS — Z23 Encounter for immunization: Secondary | ICD-10-CM

## 2010-12-25 NOTE — Progress Notes (Signed)
Patient ID: Nicholas Macdonald, male   DOB: 19-Mar-1955, 55 y.o.   MRN: [id number] cpx  Describes occasional "hard" heart beat.  Past Medical History  Diagnosis Date  . Gout   . NEPHROLITHIASIS, HX OF 11/11/2006  . HYPERGLYCEMIA 11/11/2006  . PERIPHERAL NEUROPATHY 11/10/2009  . MIGRAINE, COMMON 02/27/2009    History   Social History  . Marital Status: Married    Spouse Name: N/A    Number of Children: N/A  . Years of Education: N/A   Occupational History  . Not on file.   Social History Main Topics  . Smoking status: Never Smoker   . Smokeless tobacco: Not on file  . Alcohol Use:   . Drug Use:   . Sexually Active:    Other Topics Concern  . Not on file   Social History Narrative  . No narrative on file    Past Surgical History  Procedure Date  . Tonsillectomy   . Tracheostomy     croup    Family History  Problem Relation Age of Onset  . Atrial fibrillation Father     pacemaker  . Heart disease Father     afib    No Known Allergies  Current Outpatient Prescriptions on File Prior to Visit  Medication Sig Dispense Refill  . allopurinol (ZYLOPRIM) 300 MG tablet Take 1 tablet (300 mg total) by mouth daily. Needs ov  90 tablet  3     patient denies chest pain, shortness of breath, orthopnea. Denies lower extremity edema, abdominal pain, change in appetite, change in bowel movements. Patient denies rashes, musculoskeletal complaints. No other specific complaints in a complete review of systems.   BP 126/82  Pulse 84  Temp(Src) 98.4 F (36.9 C) (Oral)  Ht 6\' 4"  (1.93 m)  Wt 324 lb (146.965 kg)  BMI 39.44 kg/m2 Well-developed male in no acute distress. HEENT exam atraumatic, normocephalic, extraocular muscles are intact. Conjunctivae are pink without exudate. Neck is supple without lymphadenopathy, thyromegaly, jugular venous distention. Chest is clear to auscultation without increased work of breathing. Cardiac exam S1-S2 are regular. The PMI is normal. No  significant murmurs or gallops. Abdominal exam active bowel sounds, soft, nontender. No abdominal bruits. Extremities no clubbing cyanosis or edema. Peripheral pulses are normal without bruits. Neurologic exam alert and oriented without any motor or sensory deficits. Rectal exam normal tone prostate normal size without masses or asymmetry.   A/P---well visit, Health maint UTD.

## 2012-02-25 ENCOUNTER — Other Ambulatory Visit: Payer: BC Managed Care – PPO

## 2012-02-28 ENCOUNTER — Other Ambulatory Visit (INDEPENDENT_AMBULATORY_CARE_PROVIDER_SITE_OTHER): Payer: BC Managed Care – PPO

## 2012-02-28 DIAGNOSIS — Z0000 Encounter for general adult medical examination without abnormal findings: Secondary | ICD-10-CM

## 2012-02-28 LAB — CBC WITH DIFFERENTIAL/PLATELET
Basophils Absolute: 0 10*3/uL (ref 0.0–0.1)
Eosinophils Absolute: 0.3 10*3/uL (ref 0.0–0.7)
Lymphocytes Relative: 26.9 % (ref 12.0–46.0)
MCHC: 33.5 g/dL (ref 30.0–36.0)
Monocytes Absolute: 0.8 10*3/uL (ref 0.1–1.0)
Neutrophils Relative %: 59.9 % (ref 43.0–77.0)
Platelets: 249 10*3/uL (ref 150.0–400.0)
RBC: 4.7 Mil/uL (ref 4.22–5.81)
RDW: 13.8 % (ref 11.5–14.6)

## 2012-02-28 LAB — BASIC METABOLIC PANEL
Chloride: 103 mEq/L (ref 96–112)
GFR: 71.99 mL/min (ref >60.00)
Potassium: 4.1 mEq/L (ref 3.5–5.1)
Sodium: 137 mEq/L (ref 135–145)

## 2012-02-28 LAB — POCT URINALYSIS DIPSTICK
Bilirubin, UA: NEGATIVE
Glucose, UA: NEGATIVE
Leukocytes, UA: NEGATIVE
Nitrite, UA: NEGATIVE
Urobilinogen, UA: 0.2
pH, UA: 5.5

## 2012-02-28 LAB — PSA: PSA: 0.48 ng/mL (ref 0.10–4.00)

## 2012-02-28 LAB — LIPID PANEL
Cholesterol: 175 mg/dL (ref 0–200)
LDL Cholesterol: 101 mg/dL — ABNORMAL HIGH (ref 0–99)
Triglycerides: 176 mg/dL — ABNORMAL HIGH (ref 0.0–149.0)
VLDL: 35.2 mg/dL (ref 0.0–40.0)

## 2012-02-28 LAB — HEPATIC FUNCTION PANEL
ALT: 27 U/L (ref 0–53)
Albumin: 3.8 g/dL (ref 3.5–5.2)
Bilirubin, Direct: 0.1 mg/dL (ref 0.0–0.3)
Total Protein: 6.6 g/dL (ref 6.0–8.3)

## 2012-03-04 ENCOUNTER — Encounter: Payer: BC Managed Care – PPO | Admitting: Internal Medicine

## 2012-03-23 ENCOUNTER — Encounter: Payer: Self-pay | Admitting: Internal Medicine

## 2012-03-23 ENCOUNTER — Ambulatory Visit (INDEPENDENT_AMBULATORY_CARE_PROVIDER_SITE_OTHER): Payer: BC Managed Care – PPO | Admitting: Internal Medicine

## 2012-03-23 VITALS — BP 130/90 | Temp 98.2°F | Ht 75.5 in | Wt 333.0 lb

## 2012-03-23 DIAGNOSIS — R739 Hyperglycemia, unspecified: Secondary | ICD-10-CM

## 2012-03-23 DIAGNOSIS — R7309 Other abnormal glucose: Secondary | ICD-10-CM

## 2012-03-23 DIAGNOSIS — M109 Gout, unspecified: Secondary | ICD-10-CM

## 2012-03-23 DIAGNOSIS — Z0000 Encounter for general adult medical examination without abnormal findings: Secondary | ICD-10-CM

## 2012-03-23 MED ORDER — ALLOPURINOL 300 MG PO TABS: 300.0000 mg | ORAL_TABLET | Freq: Every day | ORAL | Status: DC

## 2012-03-23 NOTE — Progress Notes (Signed)
Patient ID: Nicholas Macdonald, male   DOB: 1955-12-16, 57 y.o.   MRN: [id number] cpx  Past Medical History  Diagnosis Date  . Gout   . NEPHROLITHIASIS, HX OF 11/11/2006  . HYPERGLYCEMIA 11/11/2006  . PERIPHERAL NEUROPATHY 11/10/2009  . MIGRAINE, COMMON 02/27/2009    History   Social History  . Marital Status: Married    Spouse Name: N/A    Number of Children: N/A  . Years of Education: N/A   Occupational History  . Not on file.   Social History Main Topics  . Smoking status: Never Smoker   . Smokeless tobacco: Not on file  . Alcohol Use:   . Drug Use:   . Sexually Active:    Other Topics Concern  . Not on file   Social History Narrative  . No narrative on file    Past Surgical History  Procedure Laterality Date  . Tonsillectomy    . Tracheostomy      croup    Family History  Problem Relation Age of Onset  . Atrial fibrillation Father     pacemaker  . Heart disease Father     afib    No Known Allergies  No current outpatient prescriptions on file prior to visit.   No current facility-administered medications on file prior to visit.     patient denies chest pain, shortness of breath, orthopnea. Denies lower extremity edema, abdominal pain, change in appetite, change in bowel movements. Patient denies rashes, musculoskeletal complaints. No other specific complaints in a complete review of systems.   There were no vitals taken for this visit.  well-developed well-nourished male in no acute distress. HEENT exam atraumatic, normocephalic, neck supple without jugular venous distention. Chest clear to auscultation cardiac exam S1-S2 are regular. Abdominal exam overweight with bowel sounds, soft and nontender. Extremities no edema. Neurologic exam is alert with a normal gait.   Well Visit- health maint UTD He is morbidly obese- discussed need for aggressive seight loss Hyperglycemia- discussed DM. This is all weight related. Initial goal weight should be < 220  lbs

## 2012-09-21 ENCOUNTER — Other Ambulatory Visit (INDEPENDENT_AMBULATORY_CARE_PROVIDER_SITE_OTHER): Payer: BC Managed Care – PPO

## 2012-09-21 DIAGNOSIS — R7309 Other abnormal glucose: Secondary | ICD-10-CM

## 2012-09-21 DIAGNOSIS — R739 Hyperglycemia, unspecified: Secondary | ICD-10-CM

## 2012-09-21 LAB — BASIC METABOLIC PANEL
BUN: 16 mg/dL (ref 6–23)
Chloride: 106 mEq/L (ref 96–112)
GFR: 64.48 mL/min (ref >60.00)
Potassium: 3.9 mEq/L (ref 3.5–5.1)
Sodium: 135 mEq/L (ref 135–145)

## 2012-09-21 LAB — HEMOGLOBIN A1C: Hgb A1c MFr Bld: 6.3 % (ref 4.6–6.5)

## 2012-12-21 ENCOUNTER — Ambulatory Visit (INDEPENDENT_AMBULATORY_CARE_PROVIDER_SITE_OTHER): Payer: BC Managed Care – PPO | Admitting: Internal Medicine

## 2012-12-21 ENCOUNTER — Encounter: Payer: Self-pay | Admitting: Internal Medicine

## 2012-12-21 VITALS — BP 140/86 | HR 110 | Temp 98.4°F | Resp 20 | Wt 330.0 lb

## 2012-12-21 DIAGNOSIS — M549 Dorsalgia, unspecified: Secondary | ICD-10-CM

## 2012-12-21 NOTE — Progress Notes (Signed)
  Subjective:    Patient ID: Nicholas Macdonald, male    DOB: [birth year], 57 y.o.   MRN: [id number]  HPI Pre-visit discussion using our clinic review tool. No additional management support is needed unless otherwise documented below in the visit note.  57 year old patient who presents with a 4-5 week history of left upper back pain. 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 He describes a running sensation just below the left scapula. He also describes a numb and at times pruritic-type sensation. Pain does not seem to be aggravated by movement.   He does have a history of more chronic lower back pain.  Past Medical History  Diagnosis Date  . Gout   . NEPHROLITHIASIS, HX OF 11/11/2006  . HYPERGLYCEMIA 11/11/2006  . PERIPHERAL NEUROPATHY 11/10/2009  . MIGRAINE, COMMON 02/27/2009    History   Social History  . Marital Status: Married    Spouse Name: N/A    Number of Children: N/A  . Years of Education: N/A   Occupational History  . Not on file.   Social History Main Topics  . Smoking status: Never Smoker   . Smokeless tobacco: Not on file  . Alcohol Use:   . Drug Use:   . Sexual Activity:    Other Topics Concern  . Not on file   Social History Narrative  . No narrative on file    Past Surgical History  Procedure Laterality Date  . Tonsillectomy    . Tracheostomy      croup    Family History  Problem Relation Age of Onset  . Atrial fibrillation Father     pacemaker  . Heart disease Father     afib  . Diabetes Paternal Grandmother     No Known Allergies  No current outpatient prescriptions on file prior to visit.   No current facility-administered medications on file prior to visit.    BP 140/86  Pulse 110  Temp(Src) 98.4 F (36.9 C) (Oral)  Resp 20  Wt 330 lb (149.687 kg)  SpO2 95%     Review of Systems  Musculoskeletal: Positive for back pain.       Objective:   Physical Exam  Constitutional: He appears well-developed and  well-nourished. No distress.  Repeat blood pressure 140/80  Musculoskeletal:  The area of the left upper back examined. No obvious abnormality, local tenderness or skin lesions          Assessment & Plan:   Left upper thoracic back pain. This appears to be muscular. It seems to be fairly mild the patient was concerned as to the chronicity. We'll treat with Aleve and observe at this point. He will call if there is any clinical change or fails to improve

## 2012-12-21 NOTE — Patient Instructions (Signed)
Take Aleve 200 mg twice daily for pain or swelling  Call or return to clinic prn if these symptoms worsen or fail to improve as anticipated.  

## 2012-12-21 NOTE — Progress Notes (Signed)
Pre-visit discussion using our clinic review tool. No additional management support is needed unless otherwise documented below in the visit note.  

## 2012-12-29 ENCOUNTER — Other Ambulatory Visit: Payer: Self-pay | Admitting: Internal Medicine

## 2013-01-04 ENCOUNTER — Telehealth: Payer: Self-pay | Admitting: Internal Medicine

## 2013-01-04 NOTE — Telephone Encounter (Signed)
Pt called stating that his bp is high, he is having headaches and a few episodes of a fluttering heart.  I did ask if he wanted to be seen, he declined.  Pt would like to know does he need a referral to heartcare or does he need to come in and be seen?  He is willing to see Oran Rein if necessary. Please advise.

## 2013-01-04 NOTE — Telephone Encounter (Signed)
Pt needs to be seen, ok to schedule with [facility]

## 2013-01-05 NOTE — Telephone Encounter (Signed)
Spoke with patient, set an appointment with Adline Mango 12/12 10:45 am

## 2013-01-08 ENCOUNTER — Encounter: Payer: Self-pay | Admitting: Family

## 2013-01-08 ENCOUNTER — Ambulatory Visit (INDEPENDENT_AMBULATORY_CARE_PROVIDER_SITE_OTHER): Payer: BC Managed Care – PPO | Admitting: Family

## 2013-01-08 VITALS — BP 152/98 | HR 101 | Wt 336.0 lb

## 2013-01-08 DIAGNOSIS — G43909 Migraine, unspecified, not intractable, without status migrainosus: Secondary | ICD-10-CM

## 2013-01-08 DIAGNOSIS — I10 Essential (primary) hypertension: Secondary | ICD-10-CM

## 2013-01-08 LAB — COMPREHENSIVE METABOLIC PANEL
Albumin: 4.1 g/dL (ref 3.5–5.2)
Alkaline Phosphatase: 77 U/L (ref 39–117)
BUN: 15 mg/dL (ref 6–23)
GFR: 74.85 mL/min (ref >60.00)
Glucose, Bld: 108 mg/dL — ABNORMAL HIGH (ref 70–99)
Total Bilirubin: 0.6 mg/dL (ref 0.3–1.2)

## 2013-01-08 LAB — TSH: TSH: 2.22 u[IU]/mL (ref 0.35–5.50)

## 2013-01-08 MED ORDER — METOPROLOL SUCCINATE ER 50 MG PO TB24: 50.0000 mg | ORAL_TABLET | Freq: Every day | ORAL | Status: DC

## 2013-01-08 MED ORDER — AMLODIPINE BESYLATE 5 MG PO TABS: 5.0000 mg | ORAL_TABLET | Freq: Every day | ORAL | Status: DC

## 2013-01-08 NOTE — Patient Instructions (Signed)
Sodium-Controlled Diet Sodium is a mineral. It is found in many foods. Sodium may be found naturally or added during the making of a food. The most common form of sodium is salt, which is made up of sodium and chloride. Reducing your sodium intake involves changing your eating habits. The following guidelines will help you reduce the sodium in your diet:  Stop using the salt shaker.  Use salt sparingly in cooking and baking.  Substitute with sodium-free seasonings and spices.  Do not use a salt substitute (potassium chloride) without your caregiver's permission.  Include a variety of fresh, unprocessed foods in your diet.  Limit the use of processed and convenience foods that are high in sodium. USE THE FOLLOWING FOODS SPARINGLY: Breads/Starches  Commercial bread stuffing, commercial pancake or waffle mixes, coating mixes. Waffles. Croutons. Prepared (boxed or frozen) potato, rice, or noodle mixes that contain salt or sodium. Salted French fries or hash browns. Salted popcorn, breads, crackers, chips, or snack foods. Vegetables  Vegetables canned with salt or prepared in cream, butter, or cheese sauces. Sauerkraut. Tomato or vegetable juices canned with salt.  Fresh vegetables are allowed if rinsed thoroughly. Fruit  Fruit is okay to eat. Meat and Meat Substitutes  Salted or smoked meats, such as bacon or Canadian bacon, chipped or corned beef, hot dogs, salt pork, luncheon meats, pastrami, ham, or sausage. Canned or smoked fish, poultry, or meat. Processed cheese or cheese spreads, blue or Roquefort cheese. Battered or frozen fish products. Prepared spaghetti sauce. Baked beans. Reuben sandwiches. Salted nuts. Caviar. Milk  Limit buttermilk to 1 cup per week. Soups and Combination Foods  Bouillon cubes, canned or dried soups, broth, consomm. Convenience (frozen or packaged) dinners with more than 600 mg sodium. Pot pies, pizza, Asian food, fast food cheeseburgers, and specialty  sandwiches. Desserts and Sweets  Regular (salted) desserts, pie, commercial fruit snack pies, commercial snack cakes, canned puddings.  Eat desserts and sweets in moderation. Fats and Oils  Gravy mixes or canned gravy. No more than 1 to 2 tbs of salad dressing. Chip dips.  Eat fats and oils in moderation. Beverages  See those listed under the vegetables and milk groups. Condiments  Ketchup, mustard, meat sauces, salsa, regular (salted) and lite soy sauce or mustard. Dill pickles, olives, meat tenderizer. Prepared horseradish or pickle relish. Dutch-processed cocoa. Baking powder or baking soda used medicinally. Worcestershire sauce. "Light" salt. Salt substitute, unless approved by your caregiver. Document Released: 07/06/2001 Document Revised: 04/08/2011 Document Reviewed: 02/06/2009 [employer] Patient Information 2014 [employer].  

## 2013-01-08 NOTE — Progress Notes (Signed)
Subjective:    Patient ID: Nicholas Macdonald, male    DOB: 16-May-1955, 57 y.o.   MRN: [id number]  HPI 56 year old caucasian male, nonsmoker presenting with a concern about his blood pressure.  Patient concerned that his last three visits had an elevated blood pressure.  He says he has been having visual changes, pressure in his neck, that are similar to when he was having migraines in the past. Denies any "headache" describes "discomfort." His father has cardiomyopathy and is unsure about hypertension.  He is unsure about mother health history.  He has doesn't use table salt and uses the treadmill 2-3 times a week.      Review of Systems  Constitutional: Negative.   Eyes: Positive for visual disturbance.       Complains of vision changes  Respiratory: Negative.   Cardiovascular: Positive for palpitations.       Complains of heart palpitations  Gastrointestinal: Negative.   Endocrine: Negative.   Genitourinary: Negative.   Musculoskeletal: Negative.   Skin: Negative.   Allergic/Immunologic: Negative.   Neurological: Positive for headaches.       Pressure in his temples and it affects his peripheral vision  Hematological: Negative.   Psychiatric/Behavioral: Negative.    Past Medical History  Diagnosis Date  . Gout   . NEPHROLITHIASIS, HX OF 11/11/2006  . HYPERGLYCEMIA 11/11/2006  . PERIPHERAL NEUROPATHY 11/10/2009  . MIGRAINE, COMMON 02/27/2009    History   Social History  . Marital Status: Married    Spouse Name: N/A    Number of Children: N/A  . Years of Education: N/A   Occupational History  . Not on file.   Social History Main Topics  . Smoking status: Never Smoker   . Smokeless tobacco: Not on file  . Alcohol Use:   . Drug Use:   . Sexual Activity:    Other Topics Concern  . Not on file   Social History Narrative  . No narrative on file    Past Surgical History  Procedure Laterality Date  . Tonsillectomy    . Tracheostomy      croup    Family  History  Problem Relation Age of Onset  . Atrial fibrillation Father     pacemaker  . Heart disease Father     afib  . Diabetes Paternal Grandmother     No Known Allergies  Current Outpatient Prescriptions on File Prior to Visit  Medication Sig Dispense Refill  . allopurinol (ZYLOPRIM) 300 MG tablet TAKE ONE TABLET BY MOUTH ONCE DAILY  90 tablet  0   No current facility-administered medications on file prior to visit.    BP 152/98  Pulse 101  Wt 336 lb (152.409 kg)chart    Objective:   Physical Exam  Constitutional: He is oriented to person, place, and time. He appears well-developed and well-nourished.  Eyes: Pupils are equal, round, and reactive to light.  No problems noted.  Cardiovascular: Normal rate, regular rhythm and normal heart sounds.   Heart rate at 101.  Pulmonary/Chest: Effort normal and breath sounds normal.  Neurological: He is alert and oriented to person, place, and time.  Denies headache   Skin: Skin is warm and dry.          Assessment & Plan:  Assessment 1. Uncontrolled hypertension 2. Migraine with aura but no headache.  Plan Obtain labs which include CMP, CBC, and TSH. Toprol XL 50 mg QD.  Encourage low sodium diet and exercise.  Follow-up  in four weeks.  Call office with any questions or concerns.

## 2013-01-11 ENCOUNTER — Telehealth: Payer: Self-pay | Admitting: Internal Medicine

## 2013-01-11 MED ORDER — METOPROLOL TARTRATE 50 MG PO TABS: 50.0000 mg | ORAL_TABLET | Freq: Two times a day (BID) | ORAL | Status: DC

## 2013-01-11 NOTE — Telephone Encounter (Signed)
Pt states he was given an rx of metoprolol succinate (TOPROL-XL) 50 MG 24 hr tablet on Friday and the cost is higher than he expected.  He wants to know if there is a cheaper alternative to this medication.

## 2013-01-11 NOTE — Telephone Encounter (Signed)
Rx changed to metoprolol 50mg  [facility]

## 2013-01-11 NOTE — Telephone Encounter (Signed)
Left message to advise pt of change in medication

## 2013-02-05 ENCOUNTER — Ambulatory Visit (INDEPENDENT_AMBULATORY_CARE_PROVIDER_SITE_OTHER): Payer: BC Managed Care – PPO | Admitting: Internal Medicine

## 2013-02-05 ENCOUNTER — Encounter: Payer: Self-pay | Admitting: Internal Medicine

## 2013-02-05 VITALS — BP 130/84 | HR 68 | Temp 98.6°F | Ht 75.5 in | Wt 334.0 lb

## 2013-02-05 DIAGNOSIS — I10 Essential (primary) hypertension: Secondary | ICD-10-CM | POA: Insufficient documentation

## 2013-02-05 NOTE — Assessment & Plan Note (Signed)
Well controlled Continue same meds Monitor bp at home

## 2013-02-05 NOTE — Progress Notes (Signed)
Pre visit review using our clinic review tool, if applicable. No additional management support is needed unless otherwise documented below in the visit note. 

## 2013-02-05 NOTE — Progress Notes (Signed)
htn- tolerating meds Headaches- no recurrence  Rarely has some minimal orthostatic sxs  Left shoulder- since November he has noted a pruritic, aching, mild disocmfort.   Past Medical History  Diagnosis Date  . Gout   . NEPHROLITHIASIS, HX OF 11/11/2006  . HYPERGLYCEMIA 11/11/2006  . PERIPHERAL NEUROPATHY 11/10/2009  . MIGRAINE, COMMON 02/27/2009    History   Social History  . Marital Status: Married    Spouse Name: N/A    Number of Children: N/A  . Years of Education: N/A   Occupational History  . Not on file.   Social History Main Topics  . Smoking status: Never Smoker   . Smokeless tobacco: Not on file  . Alcohol Use:   . Drug Use:   . Sexual Activity:    Other Topics Concern  . Not on file   Social History Narrative  . No narrative on file    Past Surgical History  Procedure Laterality Date  . Tonsillectomy    . Tracheostomy      croup    Family History  Problem Relation Age of Onset  . Atrial fibrillation Father     pacemaker  . Heart disease Father     afib  . Diabetes Paternal Grandmother     No Known Allergies  Current Outpatient Prescriptions on File Prior to Visit  Medication Sig Dispense Refill  . allopurinol (ZYLOPRIM) 300 MG tablet TAKE ONE TABLET BY MOUTH ONCE DAILY  90 tablet  0  . metoprolol (LOPRESSOR) 50 MG tablet Take 1 tablet (50 mg total) by mouth 2 (two) times daily.  180 tablet  3  . [DISCONTINUED] metoprolol succinate (TOPROL-XL) 50 MG 24 hr tablet Take 1 tablet (50 mg total) by mouth daily. Take with or immediately following a meal.  30 tablet  3   No current facility-administered medications on file prior to visit.     patient denies chest pain, shortness of breath, orthopnea. Denies lower extremity edema, abdominal pain, change in appetite, change in bowel movements. Patient denies rashes, musculoskeletal complaints. No other specific complaints in a complete review of systems.   BP 130/84  Pulse 68  Temp(Src) 98.6 F  (37 C) (Oral)  Ht 6' 3.5" (1.918 m)  Wt 334 lb (151.501 kg)  BMI 41.18 kg/m2  well-developed well-nourished male in no acute distress. HEENT exam atraumatic, normocephalic, neck supple without jugular venous distention. Chest clear to auscultation cardiac exam S1-S2 are regular. Abdominal exam overweight with bowel sounds, soft and nontender. Extremities no edema. Neurologic exam is alert with a normal gait. FROM both shoulders.

## 2013-05-21 ENCOUNTER — Telehealth: Payer: Self-pay | Admitting: Internal Medicine

## 2013-05-21 NOTE — Telephone Encounter (Signed)
i have never seen this patient    i have no records    Please contact patient and find out what is going on so I can review needs and make appropriate appt time If  swords cannot see him .( before making appt )  Need Ed records also

## 2013-05-21 NOTE — Telephone Encounter (Signed)
Pt needs a post follow up visit for [facility] [place] he would like to come in and see Dr Fabian SharpPanosh may I use 3:15 & 3:30 SDA slot for Mon 05/24/13 30 minute

## 2013-08-03 ENCOUNTER — Encounter: Payer: Self-pay | Admitting: Family

## 2013-08-03 ENCOUNTER — Ambulatory Visit (INDEPENDENT_AMBULATORY_CARE_PROVIDER_SITE_OTHER): Payer: BC Managed Care – PPO | Admitting: Family

## 2013-08-03 VITALS — BP 126/88 | HR 93 | Temp 98.8°F | Wt 335.0 lb

## 2013-08-03 DIAGNOSIS — I10 Essential (primary) hypertension: Secondary | ICD-10-CM

## 2013-08-03 DIAGNOSIS — R002 Palpitations: Secondary | ICD-10-CM

## 2013-08-03 NOTE — Progress Notes (Signed)
Pre visit review using our clinic review tool, if applicable. No additional management support is needed unless otherwise documented below in the visit note. 

## 2013-08-03 NOTE — Patient Instructions (Signed)

## 2013-08-04 LAB — COMPREHENSIVE METABOLIC PANEL
ALBUMIN: 4.2 g/dL (ref 3.5–5.2)
ALK PHOS: 73 U/L (ref 39–117)
ALT: 28 U/L (ref 0–53)
AST: 27 U/L (ref 0–37)
BUN: 21 mg/dL (ref 6–23)
CO2: 27 mEq/L (ref 19–32)
CREATININE: 1.1 mg/dL (ref 0.4–1.5)
Calcium: 9.9 mg/dL (ref 8.4–10.5)
Chloride: 106 mEq/L (ref 96–112)
GFR: 70.9 mL/min (ref >60.00)
GLUCOSE: 101 mg/dL — AB (ref 70–99)
POTASSIUM: 4.3 meq/L (ref 3.5–5.1)
Sodium: 139 mEq/L (ref 135–145)
Total Bilirubin: 0.4 mg/dL (ref 0.2–1.2)
Total Protein: 7.8 g/dL (ref 6.0–8.3)

## 2013-08-04 LAB — TSH: TSH: 0.67 u[IU]/mL (ref 0.35–4.50)

## 2013-08-04 NOTE — Progress Notes (Signed)
Subjective:    Patient ID: Nicholas Macdonald, male    DOB: Sep 24, 1955, 58 y.o.   MRN: [id number]  HPI 58 year old white male, nonsmoker with a history of hypertension is in today with concerns of feeling tense and had a chest x come and go. He is concerned that his blood pressure medication may not be working as well. Reports having a headache last week and had a Previously subside it taken metoprolol twice a day. He is tolerating the medications well. Typically checks his blood pressure at home he gets readings in the 120s over 80s. Reports an abnormal pulse last week. Denies any chest pain, shortness of breath, lightheadedness or dizziness.   Review of Systems  Constitutional: Negative.   Respiratory: Negative.   Cardiovascular: Negative for leg swelling.  Gastrointestinal: Negative.   Endocrine: Negative.   Genitourinary: Negative.   Musculoskeletal: Negative.   Skin: Negative.   Allergic/Immunologic: Negative.   Neurological: Negative for dizziness and light-headedness.  Hematological: Negative.   Psychiatric/Behavioral: Negative.    Past Medical History  Diagnosis Date  . Gout   . NEPHROLITHIASIS, HX OF 11/11/2006  . HYPERGLYCEMIA 11/11/2006  . PERIPHERAL NEUROPATHY 11/10/2009  . MIGRAINE, COMMON 02/27/2009    History   Social History  . Marital Status: Married    Spouse Name: N/A    Number of Children: N/A  . Years of Education: N/A   Occupational History  . Not on file.   Social History Main Topics  . Smoking status: Never Smoker   . Smokeless tobacco: Not on file  . Alcohol Use:   . Drug Use:   . Sexual Activity:    Other Topics Concern  . Not on file   Social History Narrative  . No narrative on file    Past Surgical History  Procedure Laterality Date  . Tonsillectomy    . Tracheostomy      croup    Family History  Problem Relation Age of Onset  . Atrial fibrillation Father     pacemaker  . Heart disease Father     afib  . Diabetes  Paternal Grandmother     No Known Allergies  Current Outpatient Prescriptions on File Prior to Visit  Medication Sig Dispense Refill  . allopurinol (ZYLOPRIM) 300 MG tablet TAKE ONE TABLET BY MOUTH ONCE DAILY  90 tablet  0  . metoprolol (LOPRESSOR) 50 MG tablet Take 1 tablet (50 mg total) by mouth 2 (two) times daily.  180 tablet  3  . [DISCONTINUED] metoprolol succinate (TOPROL-XL) 50 MG 24 hr tablet Take 1 tablet (50 mg total) by mouth daily. Take with or immediately following a meal.  30 tablet  3   No current facility-administered medications on file prior to visit.    BP 126/88  Pulse 93  Temp(Src) 98.8 F (37.1 C) (Oral)  Wt 335 lb (151.955 kg)chart    Objective:   Physical Exam  Constitutional: He is oriented to person, place, and time. He appears well-developed and well-nourished.  Eyes: Pupils are equal, round, and reactive to light.  Neck: Neck supple.  Cardiovascular: Normal rate, regular rhythm and normal heart sounds.   Pulmonary/Chest: Effort normal and breath sounds normal.  Abdominal: Soft. Bowel sounds are normal.  Musculoskeletal: Normal range of motion.  Neurological: He is alert and oriented to person, place, and time.  Skin: Skin is warm.  Psychiatric: He has a normal mood and affect.          Assessment & Plan:  Iantha FallenKenneth was seen today for hypertension.  Diagnoses and associated orders for this visit:  Unspecified essential hypertension - EKG 12-Lead - CMP - TSH  Palpitation - CMP - TSH    Consider Holter monitor pending the results of labs.

## 2013-08-05 ENCOUNTER — Telehealth: Payer: Self-pay | Admitting: Internal Medicine

## 2013-08-05 NOTE — Telephone Encounter (Signed)
Pt would like a call back about his results. °

## 2013-08-06 NOTE — Telephone Encounter (Signed)
Left message to advise pt of results and that they were released to his MyChart acct

## 2013-12-28 ENCOUNTER — Other Ambulatory Visit: Payer: Self-pay | Admitting: Internal Medicine

## 2013-12-28 ENCOUNTER — Other Ambulatory Visit: Payer: Self-pay | Admitting: Family

## 2015-05-04 ENCOUNTER — Encounter: Payer: Self-pay | Admitting: Gastroenterology

## 2015-06-30 ENCOUNTER — Ambulatory Visit: Payer: Self-pay | Admitting: Gastroenterology

## 2016-01-02 ENCOUNTER — Other Ambulatory Visit: Payer: Self-pay | Admitting: Internal Medicine

## 2016-01-02 DIAGNOSIS — M5416 Radiculopathy, lumbar region: Secondary | ICD-10-CM

## 2016-01-05 ENCOUNTER — Other Ambulatory Visit: Payer: Self-pay | Admitting: Internal Medicine

## 2016-01-05 ENCOUNTER — Other Ambulatory Visit: Payer: Self-pay

## 2016-01-05 ENCOUNTER — Ambulatory Visit
Admission: RE | Admit: 2016-01-05 | Discharge: 2016-01-05 | Disposition: A | Discharge status: Home / Self Care | Payer: Managed Care, Other (non HMO) | Source: Ambulatory Visit | Attending: Internal Medicine | Admitting: Internal Medicine

## 2016-01-05 DIAGNOSIS — M5416 Radiculopathy, lumbar region: Secondary | ICD-10-CM

## 2016-01-16 ENCOUNTER — Ambulatory Visit
Admission: RE | Admit: 2016-01-16 | Discharge: 2016-01-16 | Disposition: A | Discharge status: Home / Self Care | Payer: Managed Care, Other (non HMO) | Source: Ambulatory Visit | Attending: Internal Medicine | Admitting: Internal Medicine

## 2016-01-16 DIAGNOSIS — M5416 Radiculopathy, lumbar region: Secondary | ICD-10-CM

## 2016-11-01 ENCOUNTER — Encounter: Payer: Self-pay | Admitting: Gastroenterology

## 2017-01-03 ENCOUNTER — Encounter: Payer: Managed Care, Other (non HMO) | Admitting: Gastroenterology

## 2019-04-02 ENCOUNTER — Ambulatory Visit: Payer: Managed Care, Other (non HMO) | Attending: Internal Medicine

## 2019-04-02 DIAGNOSIS — Z23 Encounter for immunization: Secondary | ICD-10-CM

## 2019-04-02 NOTE — Progress Notes (Signed)
   Covid-19 Vaccination Clinic  Name:  Nicholas Macdonald    MRN: [id number] DOB: 08/11/1955  04/02/2019  Nicholas Macdonald was observed post Covid-19 immunization for 15 minutes without incident. He was provided with Vaccine Information Sheet and instruction to access the V-Safe system.   Nicholas Macdonald was instructed to call 911 with any severe reactions post vaccine: Marland Kitchen Difficulty breathing  . Swelling of face and throat  . A fast heartbeat  . A bad rash all over body  . Dizziness and weakness

## 2019-04-24 ENCOUNTER — Ambulatory Visit: Payer: Managed Care, Other (non HMO) | Attending: Internal Medicine

## 2019-04-24 DIAGNOSIS — Z23 Encounter for immunization: Secondary | ICD-10-CM

## 2019-04-24 NOTE — Progress Notes (Signed)
   Covid-19 Vaccination Clinic  Name:  Nicholas Macdonald    MRN: [id number] DOB: 1955-12-14  04/24/2019  Mr. Nicholas Macdonald was observed post Covid-19 immunization for 15 minutes without incident. He was provided with Vaccine Information Sheet and instruction to access the V-Safe system.   Mr. Nicholas Macdonald was instructed to call 911 with any severe reactions post vaccine: Marland Kitchen Difficulty breathing  . Swelling of face and throat  . A fast heartbeat  . A bad rash all over body  . Dizziness and weakness   Immunizations Administered    Name Date Dose VIS Date Route   Pfizer COVID-19 Vaccine 04/24/2019  4:23 PM 0.3 mL 01/08/2019 Intramuscular   Manufacturer: [employer], [employer]   Lot: [id number]   NDC: [address]-8

## 2019-05-03 ENCOUNTER — Ambulatory Visit: Payer: Managed Care, Other (non HMO)

## 2019-05-06 ENCOUNTER — Other Ambulatory Visit: Payer: Self-pay

## 2019-05-06 ENCOUNTER — Encounter (HOSPITAL_COMMUNITY): Payer: Self-pay | Admitting: Emergency Medicine

## 2019-05-06 ENCOUNTER — Telehealth: Payer: Self-pay

## 2019-05-06 ENCOUNTER — Emergency Department (HOSPITAL_COMMUNITY): Payer: Managed Care, Other (non HMO)

## 2019-05-06 ENCOUNTER — Emergency Department (HOSPITAL_COMMUNITY)
Admission: EM | Admit: 2019-05-06 | Discharge: 2019-05-06 | Disposition: A | Discharge status: Home / Self Care | Payer: Managed Care, Other (non HMO) | Attending: Emergency Medicine | Admitting: Emergency Medicine

## 2019-05-06 DIAGNOSIS — Z79899 Other long term (current) drug therapy: Secondary | ICD-10-CM | POA: Diagnosis not present

## 2019-05-06 DIAGNOSIS — R0789 Other chest pain: Secondary | ICD-10-CM | POA: Diagnosis not present

## 2019-05-06 DIAGNOSIS — R42 Dizziness and giddiness: Secondary | ICD-10-CM | POA: Diagnosis not present

## 2019-05-06 DIAGNOSIS — R002 Palpitations: Secondary | ICD-10-CM | POA: Insufficient documentation

## 2019-05-06 DIAGNOSIS — E119 Type 2 diabetes mellitus without complications: Secondary | ICD-10-CM | POA: Insufficient documentation

## 2019-05-06 DIAGNOSIS — R0602 Shortness of breath: Secondary | ICD-10-CM | POA: Diagnosis not present

## 2019-05-06 DIAGNOSIS — I498 Other specified cardiac arrhythmias: Secondary | ICD-10-CM | POA: Diagnosis not present

## 2019-05-06 HISTORY — DX: Type 2 diabetes mellitus without complications: E11.9

## 2019-05-06 LAB — CBC
HCT: 53.5 % — ABNORMAL HIGH (ref 39.0–52.0)
Hemoglobin: 17.2 g/dL — ABNORMAL HIGH (ref 13.0–17.0)
MCH: 30.2 pg (ref 26.0–34.0)
MCHC: 32.1 g/dL (ref 30.0–36.0)
MCV: 94 fL (ref 80.0–100.0)
Platelets: 288 10*3/uL (ref 150–400)
RBC: 5.69 MIL/uL (ref 4.22–5.81)
RDW: 14 % (ref 11.5–15.5)
WBC: 9.3 10*3/uL (ref 4.0–10.5)
nRBC: 0 % (ref 0.0–0.2)

## 2019-05-06 LAB — BASIC METABOLIC PANEL
Anion gap: 8 (ref 5–15)
BUN: 21 mg/dL (ref 8–23)
CO2: 23 mmol/L (ref 22–32)
Calcium: 9.4 mg/dL (ref 8.9–10.3)
Chloride: 105 mmol/L (ref 98–111)
Creatinine, Ser: 1.32 mg/dL — ABNORMAL HIGH (ref 0.61–1.24)
GFR calc Af Amer: >60 mL/min (ref >60)
GFR calc non Af Amer: 57 mL/min — ABNORMAL LOW (ref >60)
Glucose, Bld: 190 mg/dL — ABNORMAL HIGH (ref 70–99)
Potassium: 4.2 mmol/L (ref 3.5–5.1)
Sodium: 136 mmol/L (ref 135–145)

## 2019-05-06 LAB — HEPATIC FUNCTION PANEL
ALT: 25 U/L (ref 0–44)
AST: 26 U/L (ref 15–41)
Albumin: 4 g/dL (ref 3.5–5.0)
Alkaline Phosphatase: 86 U/L (ref 38–126)
Bilirubin, Direct: 0.1 mg/dL (ref 0.0–0.2)
Indirect Bilirubin: 0.5 mg/dL (ref 0.3–0.9)
Total Bilirubin: 0.6 mg/dL (ref 0.3–1.2)
Total Protein: 7.6 g/dL (ref 6.5–8.1)

## 2019-05-06 LAB — BRAIN NATRIURETIC PEPTIDE: B Natriuretic Peptide: 58.5 pg/mL (ref 0.0–100.0)

## 2019-05-06 LAB — TROPONIN I (HIGH SENSITIVITY)
Troponin I (High Sensitivity): 5 ng/L (ref <18)
Troponin I (High Sensitivity): 5 ng/L (ref <18)

## 2019-05-06 LAB — TSH: TSH: 2.463 u[IU]/mL (ref 0.350–4.500)

## 2019-05-06 LAB — MAGNESIUM: Magnesium: 2.1 mg/dL (ref 1.7–2.4)

## 2019-05-06 MED ORDER — METOPROLOL TARTRATE 25 MG PO TABS: 75.0000 mg | ORAL_TABLET | Freq: Once | ORAL | Status: DC

## 2019-05-06 NOTE — ED Provider Notes (Signed)
[facility]-EMERGENCY DEPT Provider Note   CSN: [id number] Arrival date & time: 05/06/19  1045     History Chief Complaint  Patient presents with  . Palpitations  . Dizziness  . Shortness of Breath    Nicholas Macdonald is a 64 y.o. male.  The history is provided by the patient and medical records. No language interpreter was used.  Palpitations Palpitations quality:  Regular Onset quality:  Gradual Duration:  1 day Timing:  Constant Progression:  Waxing and waning Chronicity:  Chronic Relieved by:  Nothing Associated symptoms: chest pain, chest pressure and shortness of breath   Associated symptoms: no back pain, no cough, no diaphoresis, no dizziness, no malaise/fatigue, no nausea, no near-syncope, no numbness, no vomiting and no weakness   Risk factors: no hx of atrial fibrillation        Past Medical History:  Diagnosis Date  . Diabetes mellitus without complication (HCC)   . Diverticulosis   . Gout   . HYPERGLYCEMIA 11/11/2006  . MIGRAINE, COMMON 02/27/2009  . NEPHROLITHIASIS, HX OF 11/11/2006  . PERIPHERAL NEUROPATHY 11/10/2009    Patient Active Problem List   Diagnosis Date Noted  . Hypertension 02/05/2013  . PERIPHERAL NEUROPATHY 11/10/2009  . MIGRAINE, COMMON 02/27/2009  . GOUT 11/11/2006  . HYPERGLYCEMIA 11/11/2006  . NEPHROLITHIASIS, HX OF 11/11/2006    Past Surgical History:  Procedure Laterality Date  . TONSILLECTOMY    . TRACHEOSTOMY     croup       Family History  Problem Relation Age of Onset  . Atrial fibrillation Father        pacemaker  . Heart disease Father        afib  . Diabetes Paternal Grandmother     Social History   Tobacco Use  . Smoking status: Never Smoker  . Smokeless tobacco: Never Used  Substance Use Topics  . Alcohol use: Not Currently  . Drug use: Not on file    Home Medications Prior to Admission medications   Medication Sig Start Date End Date Taking? Authorizing Provider    allopurinol (ZYLOPRIM) 300 MG tablet TAKE ONE TABLET BY MOUTH ONCE DAILY 12/28/13   Artist Pais, Doe-Hyun R, DO  metoprolol (LOPRESSOR) 50 MG tablet TAKE ONE TABLET BY MOUTH TWICE DAILY 12/28/13   Worthy Rancher B, FNP  metoprolol succinate (TOPROL-XL) 50 MG 24 hr tablet Take 1 tablet (50 mg total) by mouth daily. Take with or immediately following a meal. 01/08/13 01/11/13  Eulis Foster, FNP    Allergies    Patient has no known allergies.  Review of Systems   Review of Systems  Constitutional: Negative for chills, diaphoresis, fatigue, fever and malaise/fatigue.  HENT: Negative for congestion.   Eyes: Negative for visual disturbance.  Respiratory: Positive for chest tightness and shortness of breath. Negative for cough, wheezing and stridor.   Cardiovascular: Positive for chest pain and palpitations. Negative for near-syncope.  Gastrointestinal: Negative for abdominal pain, nausea and vomiting.  Genitourinary: Negative for flank pain and frequency.  Musculoskeletal: Negative for back pain, neck pain and neck stiffness.  Skin: Negative for rash and wound.  Neurological: Positive for light-headedness. Negative for dizziness, weakness, numbness and headaches.  Psychiatric/Behavioral: Negative for agitation.  All other systems reviewed and are negative.   Physical Exam Updated Vital Signs BP (!) 152/85 (BP Location: Right Arm)   Pulse (!) 106   Temp 98 F (36.7 C) (Oral)   Resp 15   SpO2 95%   Physical  Exam Vitals and nursing note reviewed.  Constitutional:      General: He is not in acute distress.    Appearance: He is well-developed. He is not ill-appearing, toxic-appearing or diaphoretic.  HENT:     Head: Normocephalic and atraumatic.     Right Ear: External ear normal.     Left Ear: External ear normal.     Nose: Nose normal.     Mouth/Throat:     Mouth: Mucous membranes are moist.     Pharynx: No oropharyngeal exudate.  Eyes:     Conjunctiva/sclera: Conjunctivae normal.      Pupils: Pupils are equal, round, and reactive to light.  Cardiovascular:     Rate and Rhythm: Normal rate. Rhythm irregular.  Extrasystoles are present.    Pulses: Normal pulses.  Pulmonary:     Effort: Pulmonary effort is normal. No tachypnea or respiratory distress.     Breath sounds: No stridor. No decreased breath sounds, wheezing, rhonchi or rales.  Chest:     Chest wall: No tenderness.  Abdominal:     Palpations: Abdomen is soft.     Tenderness: There is no abdominal tenderness. There is no guarding or rebound.  Musculoskeletal:        General: Normal range of motion.     Cervical back: Normal range of motion and neck supple.     Right lower leg: No tenderness. Edema present.     Left lower leg: No tenderness. Edema present.  Skin:    General: Skin is warm.     Findings: No erythema or rash.  Neurological:     Mental Status: He is alert and oriented to person, place, and time.     Cranial Nerves: No cranial nerve deficit.     Motor: No abnormal muscle tone.     Coordination: Coordination normal.     Deep Tendon Reflexes: Reflexes normal.     ED Results / Procedures / Treatments   Labs (all labs ordered are listed, but only abnormal results are displayed) Labs Reviewed  BASIC METABOLIC PANEL - Abnormal; Notable for the following components:      Result Value   Glucose, Bld 190 (*)    Creatinine, Ser 1.32 (*)    GFR calc non Af Amer 57 (*)    All other components within normal limits  CBC - Abnormal; Notable for the following components:   Hemoglobin 17.2 (*)    HCT 53.5 (*)    All other components within normal limits  MAGNESIUM  BRAIN NATRIURETIC PEPTIDE  TSH  HEPATIC FUNCTION PANEL  TROPONIN I (HIGH SENSITIVITY)  TROPONIN I (HIGH SENSITIVITY)    EKG EKG Interpretation  Date/Time:  Thursday May 06 2019 11:01:20 EDT Ventricular Rate:  103 PR Interval:    QRS Duration: 99 QT Interval:  321 QTC Calculation: 421 R Axis:   7 Text Interpretation: Sinus  tachycardia Multiple premature complexes, vent & supraven Low voltage, precordial leads Nonspecific repol abnormality, lateral leads Minimal ST elevation, inferior leads 12 Lead; Mason-Likar no prior eCG for comparison. Intermittent bigeminy. No STEMI Confirmed by Theda Belfast ([id number]) on 05/06/2019 2:01:58 PM   Radiology DG Chest 2 View  Result Date: 05/06/2019 CLINICAL DATA:  Tachycardia, feels like heart is racing, lightheaded, short of breath, hypertension, non insulin-dependent diabetic EXAM: CHEST - 2 VIEW COMPARISON:  None FINDINGS: Upper normal heart size. Mediastinal contours and pulmonary vascularity normal. Lungs clear. No pulmonary infiltrate, pleural effusion, or pneumothorax. Scattered endplate spur formation thoracic spine. IMPRESSION:  No acute abnormalities. Electronically Signed   By: Ulyses Southward M.D.   On: 05/06/2019 11:28    Procedures Procedures (including critical care time)  Medications Ordered in ED Medications - No data to display  ED Course  I have reviewed the triage vital signs and the nursing notes.  Pertinent labs & imaging results that were available during my care of the patient were reviewed by me and considered in my medical decision making (see chart for details).    MDM Rules/Calculators/A&P                      OVIDE DUSEK is a 64 y.o. male with a past medical history significant for diabetes, gout, kidney stones, peripheral neuropathies, migraines, and diverticulosis who presents with palpitations, lightheadedness, chest tightness, shortness of breath, and fatigue.  Patient says that he chronically has palpitations but typically they come and go briefly.  He reports that today he woke up and this morning started having intense palpitations and chest tightness feeling a whooshing and rushing sensation in his chest.  He says that it did not radiate but it was present leading to him feeling very lightheaded like he might pass out.  He also reported  feeling shortness of breath but denied nausea, vomiting, or diaphoresis.  He reports that he does not have any worsening edema than his baseline and he has been managing his diabetes fairly well for the last year.  He denies fevers, chills, urinary symptoms or GI symptoms.  He denies any trauma.  He has not had any recent medication changes.  On exam, patient does have a slow pulse.  When looking at telemetry, it appears that patient is intermittently in bigeminy with a pulse rate into the 30s at times when he is feeling symptoms.  He had clear breath sounds and chest abdomen nontender.  No murmur.  Legs had very slight edema with normal sensation and strength.  No other abnormalities on exam present.  EKG showed a sinus rhythm with occasional episodes of bigeminy seen.  Patient reports that his father and brother has had arrhythmia problems including A. fib and some other arrhythmia.  Patient denies any history of arrhythmia or A. fib.  Clinically due to the edema seen I will get a BNP and I think patient is having symptomatic bradycardia affectively from his bigeminy.  We will get screening labs to look for electrolyte imbalance or any evidence of heart injury or fluid buildup.  If work-up is reassuring and patient is feeling better with reassuring orthostatics, patient is likely be stable for discharge home with his bigeminy however will likely touch base with cardiology to ensure follow-up if work-up is reassuring.      5:06 PM Work-up was overall reassuring with a negative troponin x2.  Patient's magnesium levels normal and potassium is normal.  Creatinine is 1.3 which is slightly more elevated than prior.  Liver function normal.  No leukocytosis.  Hemoglobin elevated.  TSH is normal.  Chest x-ray is unremarkable.  On telemetry, patient is having the bigeminal episodes and is only refusing around 30 times a minute when he is in the persistent bigeminy.  We will call cardiology to discuss if they  feel patient should be monitored overnight on telemetry to make change to his beta-blocker regimen or if he is safe for discharge home and outpatient cardiology follow-up.  5:28 PM Spoke with Dr. Mayford Knife who recommended increasing the patient to 75 mg twice a day  of his metoprolol and they will see him in clinic tomorrow for further management.  Patient reports he does not take his beta-blocker until after dinner at 8 PM and he will wait to take it at home.  He will take 1.5 tablets of his 50 mg medication.  He understands return precautions and plan of care after work-up was overall reassuring.  Patient has no other questions or concerns and was discharged in good condition.   Final Clinical Impression(s) / ED Diagnoses Final diagnoses:  Palpitations  Lightheaded  Chest discomfort  Bigeminy    Rx / DC Orders ED Discharge Orders    None     Clinical Impression: 1. Palpitations   2. Lightheaded   3. Chest discomfort   4. Bigeminy     Disposition: Discharge  Condition: Good  I have discussed the results, Dx and Tx plan with the pt(& family if present). He/she/they expressed understanding and agree(s) with the plan. Discharge instructions discussed at great length. Strict return precautions discussed and pt &/or family have verbalized understanding of the instructions. No further questions at time of discharge.    New Prescriptions   No medications on file    Follow Up: [place] [address] [phone] In 1 day      Kaisen Ackers, Gwenyth Allegra, MD 05/06/19 2200

## 2019-05-06 NOTE — Telephone Encounter (Signed)
-----   Message from Quintella Reichert, MD sent at 05/06/2019  5:14 PM EDT ----- Patient seen in ER today with CP and normal enzymes, lightheadedness and palpitations - having lots of PACs and PVCs.  BB increased and needs to be seen in office tomorrow by extender or DOD  Nicholas Macdonald

## 2019-05-06 NOTE — ED Triage Notes (Signed)
Pt reports that this morning upon waking up he feels that his heart is racing, feeling lightheaded and feeling little SOB. Pt reports had this before but never lasted this long.

## 2019-05-06 NOTE — Telephone Encounter (Signed)
Spoke with patient and made him an appointment to see Dr. Johney Frame tomorrow.

## 2019-05-06 NOTE — ED Notes (Signed)
Pt ambulated to room without complaining of dizziness.

## 2019-05-06 NOTE — Discharge Instructions (Addendum)
Your laboratory testing today was overall reassuring but your telemetry and EKG shows evidence of the bigeminy which we think is causing her symptoms.  After speaking with cardiology, they wanted to increase your beta-blocker to 75 mg (1.5 tablets) twice a day.  They will see you in clinic to tomorrow to discuss further management.  If any symptoms change overnight before being seen, please return to the emergency department but please rest and stay hydrated otherwise.

## 2019-05-07 ENCOUNTER — Ambulatory Visit: Payer: Managed Care, Other (non HMO) | Admitting: Internal Medicine

## 2019-05-07 ENCOUNTER — Encounter: Payer: Self-pay | Admitting: Internal Medicine

## 2019-05-07 VITALS — BP 120/78 | HR 89 | Ht 76.0 in | Wt 324.0 lb

## 2019-05-07 DIAGNOSIS — R002 Palpitations: Secondary | ICD-10-CM

## 2019-05-07 DIAGNOSIS — I10 Essential (primary) hypertension: Secondary | ICD-10-CM | POA: Diagnosis not present

## 2019-05-07 DIAGNOSIS — R079 Chest pain, unspecified: Secondary | ICD-10-CM

## 2019-05-07 DIAGNOSIS — I491 Atrial premature depolarization: Secondary | ICD-10-CM

## 2019-05-07 MED ORDER — METOPROLOL TARTRATE 50 MG PO TABS: 75.0000 mg | ORAL_TABLET | Freq: Two times a day (BID) | ORAL | 3 refills | Status: DC

## 2019-05-07 NOTE — Progress Notes (Signed)
Electrophysiology Office Note   Date:  05/07/2019   ID:  Danner, [place] 04/13/1955, MRN [id number]  PCP:  Creola Corn, MD  Cardiologist:  none Primary Electrophysiologist: Hillis Range, MD    CC: palpitations   History of Present Illness: JC VERON is a 64 y.o. male who presents today for electrophysiology evaluation.   He reports having symptoms of palpitations "for years".  Typically, he reports brief (1-2 minutes) of irregular heart beat with a "rush in (my) chest".  Episodes would occur only every few months.  Yesterday, he noticed very frequent palpitations which did not resolve and lasted all day.  He reports feeling "off (my) game" all day.  He presented to [facility] ED and was found to have PACs in bigeminy.  His metoprolol 50mg  bid (HTN) was increased to 75mg  BID and he was discharged.  He slept "great" last night but has noticed some palpitations again today.  He took his metoprolol and they have since improved.  Today, he denies symptoms of chest pain, shortness of breath, orthopnea, PND, dizziness, presyncope, syncope, bleeding, or neurologic sequela.  He has chronic mild edema. The patient is tolerating medications without difficulties and is otherwise without complaint today.    Past Medical History:  Diagnosis Date  . Diabetes mellitus without complication (HCC)   . Diverticulosis   . Essential hypertension   . Gout   . HYPERGLYCEMIA 11/11/2006  . MIGRAINE, COMMON 02/27/2009  . NEPHROLITHIASIS, HX OF 11/11/2006  . Overweight   . PERIPHERAL NEUROPATHY 11/10/2009  . Premature atrial contractions    Past Surgical History:  Procedure Laterality Date  . TONSILLECTOMY    . TRACHEOSTOMY     croup     Current Outpatient Medications  Medication Sig Dispense Refill  . allopurinol (ZYLOPRIM) 300 MG tablet TAKE ONE TABLET BY MOUTH ONCE DAILY 90 tablet 0  . atorvastatin (LIPITOR) 10 MG tablet Take 10 mg by mouth daily.    11/13/2006 JARDIANCE 10 MG TABS tablet  Take 10 mg by mouth daily.    . metFORMIN (GLUCOPHAGE) 500 MG tablet Take 500 mg by mouth daily.    . metoprolol (LOPRESSOR) 50 MG tablet TAKE ONE TABLET BY MOUTH TWICE DAILY 180 tablet 0  . OZEMPIC, 1 MG/DOSE, 2 MG/1.5ML SOPN Inject 1 mg into the skin once a week. Thursday     No current facility-administered medications for this visit.    Allergies:   Patient has no known allergies.   Social History:  The patient  reports that he has never smoked. He has never used smokeless tobacco. He reports previous alcohol use. He reports previous drug use.   Family History:  The patient's family history includes Atrial fibrillation in his father; Cardiomyopathy in his father; Diabetes in his paternal grandmother; Heart disease in his father.    ROS:  Please see the history of present illness.   All other systems are personally reviewed and negative.    PHYSICAL EXAM: VS:  BP 120/78   Pulse 89   Ht 6\' 4"  (1.93 m)   Wt (!) 324 lb (147 kg)   SpO2 93%   BMI 39.44 kg/m  , BMI Body mass index is 39.44 kg/m. GEN: Well nourished, well developed, in no acute distress  HEENT: normal  Neck: no JVD, carotid bruits, or masses Cardiac: RRR; no murmurs, rubs, or gallops,no edema  Respiratory:  clear to auscultation bilaterally, normal work of breathing GI: soft, nontender, nondistended, + BS MS: no deformity  or atrophy  Skin: warm and dry  Neuro:  Strength and sensation are intact Psych: euthymic mood, full affect  EKG:  EKG is ordered today. The ekg ordered today is personally reviewed and shows sinus with PACs (frequent) and rare PVCs   Recent Labs: 05/06/2019: ALT 25; B Natriuretic Peptide 58.5; BUN 21; Creatinine, Ser 1.32; Hemoglobin 17.2; Magnesium 2.1; Platelets 288; Potassium 4.2; Sodium 136; TSH 2.463  personally reviewed   Lipid Panel     Component Value Date/Time   CHOL 175 02/28/2012 1027   TRIG 176.0 (H) 02/28/2012 1027   HDL 39.30 02/28/2012 1027   CHOLHDL 4 02/28/2012 1027    VLDL 35.2 02/28/2012 1027   LDLCALC 101 (H) 02/28/2012 1027   LDLDIRECT 121.5 07/03/2009 0924   personally reviewed   Wt Readings from Last 3 Encounters:  05/07/19 (!) 324 lb (147 kg)  08/03/13 (!) 335 lb (152 kg)  02/05/13 (!) 334 lb (151.5 kg)      Other studies personally reviewed: Additional studies/ records that were reviewed today include: recent ER notes,  Prior ekgs, echo from 2008  Review of the above records today demonstrates: as above   ASSESSMENT AND PLAN:  1.  Palpitations Secondary to PACs We discussed at length today The importance of lifestyle modification was discussed at length Obtain echo to evaluate for structural changes related to his PACs/ palpitations given his FH of cardiomyopathy (father) Sleep study Regular exercise and stress reduction discussed He can take additional metoprolol prn  2. HTN Stable No change required today    Follow-up:  6 weeks with me  Current medicines are reviewed at length with the patient today.   The patient does not have concerns regarding his medicines.  The following changes were made today:  none  Labs/ tests ordered today include:  Orders Placed This Encounter  Procedures  . EKG 12-Lead     Signed, Thompson Grayer, MD  05/07/2019 10:59 AM     [facility] [address] [place] [address] [phone] (office) [phone] (fax)

## 2019-05-07 NOTE — Patient Instructions (Addendum)
Medication Instructions:  Your physician recommends that you continue on your current medications as directed. Please refer to the Current Medication list given to you today.  Labwork: None ordered.  Testing/Procedures: Your physician has requested that you have an echocardiogram. Echocardiography is a painless test that uses sound waves to create images of your heart. It provides your doctor with information about the size and shape of your heart and how well your heart's chambers and valves are working. This procedure takes approximately one hour. There are no restrictions for this procedure.  Please schedule for echo  Your physician has recommended that you have a sleep study. This test records several body functions during sleep, including: brain activity, eye movement, oxygen and carbon dioxide blood levels, heart rate and rhythm, breathing rate and rhythm, the flow of air through your mouth and nose, snoring, body muscle movements, and chest and belly movement.  Follow-Up: Your physician wants you to follow-up in: 6 week virtual visit with Dr. Johney Frame.  Jun 21, 2019 at 12:15 PM virtual visit  Any Other Special Instructions Will Be Listed Below (If Applicable).  If you need a refill on your cardiac medications before your next appointment, please call your pharmacy.

## 2019-05-07 NOTE — Progress Notes (Signed)
Patient Name: Markon Jares        DOB: Nov 11, 1955      Height:  6'4"    Weight:  324 pounds  Office Name:  [facility]         Referring Provider:  Dr. Johney Frame  Today's Date:  05/07/2019   STOP BANG RISK ASSESSMENT S (snore) Have you been told that you snore?     NO-lives alone   T (tired) Are you often tired, fatigued, or sleepy during the day?   NO  O (obstruction) Do you stop breathing, choke, or gasp during sleep? NO/not sure   P (pressure) Do you have or are you being treated for high blood pressure? YES  B (BMI) Is your body index greater than 35 kg/m? YES   A (age) Are you 64 years old or older? YES   N (neck) Do you have a neck circumference greater than 16 inches?   YES   G (gender) Are you a male? YES   TOTAL STOP/BANG "YES" ANSWERS                                                                        For Office Use Only              Procedure Order Form    YES to 3+ Stop Bang questions OR two clinical symptoms - patient qualifies for [employer]PAT (CPT 95800)     Submit: This Form + Patient Face Sheet + Clinical Note via [employer] or Fax: [phone]         Clinical Notes: Will consult Sleep Specialist and refer for management of therapy due to patient increased risk of Sleep Apnea. Ordering a sleep study due to the following two clinical symptoms: Excessive daytime sleepiness G47.10 / Gastroesophageal reflux K21.9 / Nocturia R35.1 / Morning Headaches G44.221 / Difficulty concentrating R41.840 / Memory problems or poor judgment G31.84 / Personality changes or irritability R45.4 / Loud snoring R06.83 / Depression F32.9 / Unrefreshed by sleep G47.8 / Impotence N52.9 / History of high blood pressure R03.0 / Insomnia G47.00    I understand that I am proceeding with a home sleep apnea test as ordered by my treating physician. I understand that untreated sleep apnea is a serious cardiovascular risk factor and it is my responsibility to perform the test and seek management  for sleep apnea. I will be contacted with the results and be managed for sleep apnea by a local sleep physician. I will be receiving equipment and further instructions from [facility]. I shall promptly ship back the equipment via the included mailing label. I understand my insurance will be billed for the test and as the patient I am responsible for any insurance related out-of-pocket costs incurred. I have been provided with written instructions and can call for additional video or telephonic instruction, with 24-hour availability of qualified personnel to answer any questions: Patient Help Desk [phone].  Patient Signature ______________________________________________________   Date______________________ Patient Telemedicine Verbal Consent

## 2019-05-20 ENCOUNTER — Telehealth: Payer: Self-pay | Admitting: *Deleted

## 2019-05-20 NOTE — Telephone Encounter (Signed)
Fax came today for patient staling he does not want to proceed with his sleep study at this time. The order was voided. Patient wants to speak to his provider.

## 2019-05-24 NOTE — Telephone Encounter (Signed)
LMTCB

## 2019-05-24 NOTE — Telephone Encounter (Signed)
See MyChart message

## 2019-05-28 ENCOUNTER — Ambulatory Visit (HOSPITAL_COMMUNITY): Payer: Managed Care, Other (non HMO) | Attending: Internal Medicine

## 2019-05-28 ENCOUNTER — Other Ambulatory Visit: Payer: Self-pay

## 2019-05-28 DIAGNOSIS — I491 Atrial premature depolarization: Secondary | ICD-10-CM | POA: Diagnosis not present

## 2019-05-28 DIAGNOSIS — I10 Essential (primary) hypertension: Secondary | ICD-10-CM | POA: Diagnosis not present

## 2019-05-28 DIAGNOSIS — R002 Palpitations: Secondary | ICD-10-CM | POA: Diagnosis not present

## 2019-05-28 MED ORDER — PERFLUTREN LIPID MICROSPHERE
1.0000 mL | INTRAVENOUS | Status: AC | PRN
  Administered 2019-05-28: 3 mL via INTRAVENOUS

## 2019-06-02 ENCOUNTER — Other Ambulatory Visit: Payer: Self-pay

## 2019-06-02 ENCOUNTER — Encounter: Payer: Self-pay | Admitting: Internal Medicine

## 2019-06-02 ENCOUNTER — Ambulatory Visit: Payer: Managed Care, Other (non HMO) | Admitting: Internal Medicine

## 2019-06-02 VITALS — BP 122/74 | HR 86 | Ht 76.0 in | Wt 335.0 lb

## 2019-06-02 DIAGNOSIS — R002 Palpitations: Secondary | ICD-10-CM | POA: Diagnosis not present

## 2019-06-02 DIAGNOSIS — I491 Atrial premature depolarization: Secondary | ICD-10-CM | POA: Diagnosis not present

## 2019-06-02 DIAGNOSIS — I110 Hypertensive heart disease with heart failure: Secondary | ICD-10-CM

## 2019-06-02 DIAGNOSIS — I519 Heart disease, unspecified: Secondary | ICD-10-CM | POA: Diagnosis not present

## 2019-06-02 DIAGNOSIS — I5020 Unspecified systolic (congestive) heart failure: Secondary | ICD-10-CM

## 2019-06-02 NOTE — Progress Notes (Signed)
   PCP: Creola Corn, MD   Primary EP: Dr Johney Frame  Nicholas Macdonald is a 64 y.o. male who presents today for routine electrophysiology followup.  Since last being seen in our clinic, the patient reports doing very well.  Today, he denies symptoms of palpitations, chest pain, shortness of breath,  lower extremity edema, dizziness, presyncope, or syncope.  The patient is otherwise without complaint today.   Past Medical History:  Diagnosis Date  . Diabetes mellitus without complication (HCC)   . Diverticulosis   . Essential hypertension   . Gout   . HYPERGLYCEMIA 11/11/2006  . MIGRAINE, COMMON 02/27/2009  . NEPHROLITHIASIS, HX OF 11/11/2006  . Overweight   . PERIPHERAL NEUROPATHY 11/10/2009  . Premature atrial contractions    Past Surgical History:  Procedure Laterality Date  . TONSILLECTOMY    . TRACHEOSTOMY     croup    ROS- all systems are reviewed and negatives except as per HPI above  Current Outpatient Medications  Medication Sig Dispense Refill  . allopurinol (ZYLOPRIM) 300 MG tablet TAKE ONE TABLET BY MOUTH ONCE DAILY 90 tablet 0  . atorvastatin (LIPITOR) 10 MG tablet Take 10 mg by mouth daily.    Marland Kitchen JARDIANCE 10 MG TABS tablet Take 10 mg by mouth daily.    . metFORMIN (GLUCOPHAGE) 500 MG tablet Take 500 mg by mouth daily.    . metoprolol tartrate (LOPRESSOR) 50 MG tablet Take 1.5 tablets (75 mg total) by mouth 2 (two) times daily. 270 tablet 3  . OZEMPIC, 1 MG/DOSE, 2 MG/1.5ML SOPN Inject 1 mg into the skin once a week. Thursday     No current facility-administered medications for this visit.    Physical Exam: Vitals:   06/02/19 1642  BP: 122/74  Pulse: 86  SpO2: 92%  Weight: (!) 335 lb (152 kg)  Height: 6\' 4"  (1.93 m)    GEN- The patient is well appearing, alert and oriented x 3 today.   Head- normocephalic, atraumatic Eyes-  Sclera clear, conjunctiva pink Ears- hearing intact Oropharynx- clear Lungs- Clear to ausculation bilaterally, normal work of  breathing Heart- Regular rate and rhythm, no murmurs, rubs or gallops, PMI not laterally displaced GI- soft, NT, ND, + BS Extremities- no clubbing, cyanosis, or edema  Wt Readings from Last 3 Encounters:  06/02/19 (!) 335 lb (152 kg)  05/07/19 (!) 324 lb (147 kg)  08/03/13 (!) 335 lb (152 kg)    EKG tracing ordered today is personally reviewed and shows sinus Echo- EF 40-45%, moderate to severe LA enlargement  Assessment and Plan:  1. Palpitations Due to PACs Stable No change required today  2. LV dysfunction/ chf He has moderately reduced LV function with moderate to severe LV enlargement of unclear etiology. Will order cardiac CT to evaluate for CAD. Refer to general cardiology for further evaluation and management. His father had a nonischemic CM.  Could consider genetic testing pending workup.  3. Hypertensive cardiovascular disease Stable No change required today  4. Morbid obesity Body mass index is 40.78 kg/m. Lifestyle modification is encouraged  Refer to general cardiology for further evaluation and management  10/04/13 MD, [facility] 06/02/2019 4:45 PM

## 2019-06-02 NOTE — Patient Instructions (Signed)
Medication Instructions:  Your physician recommends that you continue on your current medications as directed. Please refer to the Current Medication list given to you today.  *If you need a refill on your cardiac medications before your next appointment, please call your pharmacy*   Lab Work: None ordered If you have labs (blood work) drawn today and your tests are completely normal, you will receive your results only by: Marland Kitchen MyChart Message (if you have MyChart) OR . A paper copy in the mail If you have any lab test that is abnormal or we need to change your treatment, we will call you to review the results.   Testing/Procedures: Your physician has requested that you have cardiac CT. Cardiac computed tomography (CT) is a painless test that uses an x-ray machine to take clear, detailed pictures of your heart. For further information please visit [email or web address] Please follow instruction sheet as given.     Follow-Up: At [facility], you and your health needs are our priority.  As part of our continuing mission to provide you with exceptional heart care, we have created designated Provider Care Teams.  These Care Teams include your primary Cardiologist (physician) and Advanced Practice Providers (APPs -  Physician Assistants and Nurse Practitioners) who all work together to provide you with the care you need, when you need it.  We recommend signing up for the patient portal called "MyChart".  Sign up information is provided on this After Visit Summary.  MyChart is used to connect with patients for Virtual Visits (Telemedicine).  Patients are able to view lab/test results, encounter notes, upcoming appointments, etc.  Non-urgent messages can be sent to your provider as well.   To learn more about what you can do with MyChart, go to [email or web address].    Your next appointment:   To be referred   The format for your next appointment:   In Person  Provider:   Lennie Odor,  MD   Thank you for choosing [facility]!!     Other Instructions

## 2019-06-04 NOTE — Addendum Note (Signed)
Addended by: Sohail Capraro H on: 06/04/2019 01:37 PM   Modules accepted: Orders  

## 2019-06-16 ENCOUNTER — Telehealth (HOSPITAL_COMMUNITY): Payer: Self-pay | Admitting: *Deleted

## 2019-06-16 NOTE — Telephone Encounter (Signed)

## 2019-06-17 ENCOUNTER — Other Ambulatory Visit: Payer: Self-pay

## 2019-06-17 ENCOUNTER — Ambulatory Visit (HOSPITAL_COMMUNITY)
Admission: RE | Admit: 2019-06-17 | Discharge: 2019-06-17 | Disposition: A | Discharge status: Home / Self Care | Payer: Managed Care, Other (non HMO) | Source: Ambulatory Visit | Attending: Internal Medicine | Admitting: Internal Medicine

## 2019-06-17 ENCOUNTER — Encounter (HOSPITAL_COMMUNITY): Payer: Self-pay

## 2019-06-17 DIAGNOSIS — I519 Heart disease, unspecified: Secondary | ICD-10-CM | POA: Diagnosis not present

## 2019-06-17 MED ORDER — DILTIAZEM HCL 25 MG/5ML IV SOLN
10.0000 mg | Freq: Once | INTRAVENOUS | Status: AC
  Filled 2019-06-17: qty 5

## 2019-06-17 MED ORDER — DILTIAZEM LOAD VIA INFUSION: 10.0000 mg | Freq: Once | INTRAVENOUS | Status: DC

## 2019-06-17 MED ORDER — DILTIAZEM HCL 25 MG/5ML IV SOLN
INTRAVENOUS | Status: AC
  Administered 2019-06-17: 10 mg via INTRAVENOUS
  Filled 2019-06-17: qty 5

## 2019-06-17 MED ORDER — METOPROLOL TARTRATE 5 MG/5ML IV SOLN
5.0000 mg | INTRAVENOUS | Status: DC | PRN
  Administered 2019-06-17 (×2): 5 mg via INTRAVENOUS

## 2019-06-17 MED ORDER — METOPROLOL TARTRATE 5 MG/5ML IV SOLN
INTRAVENOUS | Status: AC
  Filled 2019-06-17: qty 20

## 2019-06-17 NOTE — Progress Notes (Signed)
Patient arrived with heart rate in the 100's. Patient was given two doses of metoprolol without much change in heart rate. Called MD and was told to give patient Cardizem. Patient's blood pressure when from high 140's to 114 systolic and heart rate only slightly declined to 80's.  Called and spoke to MD who advised to reschedule patient and will have premedication changed. Advised patient. Patient verbalized understanding. Patient asymptomatic with decrease in blood pressure. Patient discharged.

## 2019-06-18 ENCOUNTER — Other Ambulatory Visit: Payer: Managed Care, Other (non HMO) | Admitting: *Deleted

## 2019-06-18 ENCOUNTER — Other Ambulatory Visit: Payer: Self-pay | Admitting: *Deleted

## 2019-06-18 ENCOUNTER — Telehealth: Payer: Self-pay

## 2019-06-18 DIAGNOSIS — I5020 Unspecified systolic (congestive) heart failure: Secondary | ICD-10-CM

## 2019-06-18 DIAGNOSIS — I110 Hypertensive heart disease with heart failure: Secondary | ICD-10-CM

## 2019-06-18 DIAGNOSIS — I25119 Atherosclerotic heart disease of native coronary artery with unspecified angina pectoris: Secondary | ICD-10-CM

## 2019-06-18 DIAGNOSIS — R079 Chest pain, unspecified: Secondary | ICD-10-CM

## 2019-06-18 NOTE — Telephone Encounter (Signed)
Placed 3 - 5 mg tablets of Corlanor at the screening table for the patient to pick up   Sn [id number] Lot [id number] exp 11/21   [id number]

## 2019-06-18 NOTE — Progress Notes (Signed)
BMET ordered on this pt, under Dr. Johney Frame, for upcoming Coronary CT.  Pt will report for BMET today in our office.  CT Scheduler Jacquelynn Cree requested the BMET be placed, per Coronary CT protocol.

## 2019-06-19 LAB — BASIC METABOLIC PANEL
BUN/Creatinine Ratio: 13 (ref 10–24)
BUN: 18 mg/dL (ref 8–27)
CO2: 25 mmol/L (ref 20–29)
Calcium: 10 mg/dL (ref 8.6–10.2)
Chloride: 102 mmol/L (ref 96–106)
Creatinine, Ser: 1.36 mg/dL — ABNORMAL HIGH (ref 0.76–1.27)
GFR calc Af Amer: 64 mL/min/{1.73_m2} (ref >59)
GFR calc non Af Amer: 55 mL/min/{1.73_m2} — ABNORMAL LOW (ref >59)
Glucose: 113 mg/dL — ABNORMAL HIGH (ref 65–99)
Potassium: 4.7 mmol/L (ref 3.5–5.2)
Sodium: 141 mmol/L (ref 134–144)

## 2019-06-21 ENCOUNTER — Telehealth: Payer: Managed Care, Other (non HMO) | Admitting: Internal Medicine

## 2019-06-22 ENCOUNTER — Telehealth (HOSPITAL_COMMUNITY): Payer: Self-pay | Admitting: *Deleted

## 2019-06-22 NOTE — Telephone Encounter (Signed)
Pt reaching out regarding upcoming cardiac imaging study; pt verbalizes understanding of appt date/time, parking situation and where to check in, pre-test NPO status and medications ordered, and verified current allergies; name and call back number provided for further questions should they arise ° °Jakyla Reza Tai RN Navigator Cardiac Imaging °[facility] °[phone] office °[phone] cell ° °

## 2019-06-24 ENCOUNTER — Encounter (HOSPITAL_COMMUNITY): Payer: Self-pay

## 2019-06-24 ENCOUNTER — Ambulatory Visit (HOSPITAL_COMMUNITY)
Admission: RE | Admit: 2019-06-24 | Discharge: 2019-06-24 | Disposition: A | Discharge status: Home / Self Care | Payer: Managed Care, Other (non HMO) | Source: Ambulatory Visit | Attending: Internal Medicine | Admitting: Internal Medicine

## 2019-06-24 DIAGNOSIS — I519 Heart disease, unspecified: Secondary | ICD-10-CM | POA: Diagnosis not present

## 2019-06-24 DIAGNOSIS — I2510 Atherosclerotic heart disease of native coronary artery without angina pectoris: Secondary | ICD-10-CM

## 2019-06-24 MED ORDER — IOHEXOL 350 MG/ML SOLN
80.0000 mL | Freq: Once | INTRAVENOUS | Status: AC | PRN
  Administered 2019-06-24: 80 mL via INTRAVENOUS

## 2019-06-24 MED ORDER — NITROGLYCERIN 0.4 MG SL SUBL
0.8000 mg | SUBLINGUAL_TABLET | Freq: Once | SUBLINGUAL | Status: AC
  Administered 2019-06-24: 0.8 mg via SUBLINGUAL

## 2019-06-24 MED ORDER — NITROGLYCERIN 0.4 MG SL SUBL
SUBLINGUAL_TABLET | SUBLINGUAL | Status: AC
  Filled 2019-06-24: qty 2

## 2019-06-24 MED ORDER — METOPROLOL TARTRATE 5 MG/5ML IV SOLN
INTRAVENOUS | Status: AC
  Filled 2019-06-24: qty 5

## 2019-06-24 MED ORDER — METOPROLOL TARTRATE 5 MG/5ML IV SOLN
5.0000 mg | INTRAVENOUS | Status: DC | PRN
  Administered 2019-06-24: 5 mg via INTRAVENOUS

## 2019-06-25 ENCOUNTER — Telehealth: Payer: Self-pay | Admitting: Internal Medicine

## 2019-06-25 NOTE — Telephone Encounter (Signed)
Returned call to pt.  Per CT results:  IMPRESSION: 1.  Mid LAD lesion is positive for obstructive CAD by CT FFR (0.68).  Lennie Odor, MD   Pt HAD upcoming appt scheduled with Dr. Flora Lipps for July 19, 2019.  Was able to find appt with Dr. Flora Lipps on June 2 at 10:20 am.  Pt aware of results.  Was pleased with sooner appt.  Briefly discussed possibility of needing a cardiac catheterization.  Will discuss further with Dr. Chriss Driver Dr. Johney Frame of results.

## 2019-06-25 NOTE — Telephone Encounter (Signed)
New Message  Pt called regarding his Cardiac CT results.

## 2019-06-26 NOTE — Progress Notes (Signed)
Cardiology Office Note:   Date:  06/30/2019  NAME:  Nicholas Macdonald    MRN: [id number] DOB:  May 28, 1955   PCP:  Creola Corn, MD  Cardiologist:  Reatha Harps, MD   Referring MD: Creola Corn, MD   Chief Complaint  Patient presents with  . Coronary Artery Disease   History of Present Illness:   Nicholas Macdonald is a 64 y.o. male with a hx of PACs, CAD, Ascending aortic aneurysm, CHF who presents for the evaluation of CAD at the request of Creola Corn, MD.  He was evaluated by electrophysiology on 05/06/2019.  This was after an emergency room visit for palpitations.  Apparently he had PACs.  He was evaluated by electrophysiology with an echocardiogram which showed mildly reduced LV function 40-45%.  There was no regional wall motion abnormalities noted.  He did undergo a coronary CTA which showed obstructive CAD in the mid LAD.  RCA and left circumflex are patent.  He reports his main reason for seeing Dr. Johney Frame was palpitations.  He reports no symptoms of chest pain or shortness of breath.  His risk factors for heart disease include obesity (BMI 41), hypertension, diabetes.  He reports his A1c is well controlled.  I do not have the results from his primary care physician.  He does take Lipitor 10 mg but not know what his most recent lipid profile was.  His blood pressure is 124/70 today.  He reports he is doing well without any symptoms.  He remains on metoprolol succinate 75 mg twice daily for his PACs.  I did inquire about sleep apnea and Dr. Johney Frame did order a sleep study.  Apparently he was unable to complete this.  He has pursued a Nutritional therapist which is monitoring his sleep.  We do need to consider formal testing.  His EKG today demonstrates normal sinus rhythm without any acute ST-T changes and no evidence of prior infarction.  He reports that he does not exercise routinely but with his current level activity experienced no chest pain or shortness of breath.  He appears to be without  symptoms regarding his CAD.  There is no strong family history of thoracic aortic aneurysm.  CTA did demonstrate an aortic root aneurysm up to 46 mm.  He will need a repeat study in 6 months.  Family history is significant for father who had atrial fibrillation.  His brother is a patient of mine.  His brother had atrial fibrillation but it is paroxysmal.  Seems to be doing well.  He is a never smoker and does not consume alcohol or use any drugs.  He does work as a Building services engineer for multiple companies apparently the business model is to provide [place] on a part-time basis for [employer].  Problem List 1. PACs 2. 1 vessel CAD -70-99% mid LAD (CT FFR 0.68) 3. 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 Subclinical LV dysfunction -EF 40-45%   Past Medical History: Past Medical History:  Diagnosis Date  . Diabetes mellitus without complication (HCC)   . Diverticulosis   . Essential hypertension   . Gout   . HYPERGLYCEMIA 11/11/2006  . Hyperlipidemia   . MIGRAINE, COMMON 02/27/2009  . NEPHROLITHIASIS, HX OF 11/11/2006  . Overweight   . PERIPHERAL NEUROPATHY 11/10/2009  . Premature atrial contractions     Past Surgical History: Past Surgical History:  Procedure Laterality Date  . TONSILLECTOMY    . TRACHEOSTOMY     croup  Current Medications: Current Meds  Medication Sig  . allopurinol (ZYLOPRIM) 300 MG tablet TAKE ONE TABLET BY MOUTH ONCE DAILY  . atorvastatin (LIPITOR) 40 MG tablet Take 1 tablet (40 mg total) by mouth daily.  Marland Kitchen. JARDIANCE 10 MG TABS tablet Take 10 mg by mouth daily.  . metFORMIN (GLUCOPHAGE) 500 MG tablet Take 500 mg by mouth daily.  Marland Kitchen. OZEMPIC, 1 MG/DOSE, 2 MG/1.5ML SOPN Inject 1 mg into the skin once a week. Thursday  . [DISCONTINUED] atorvastatin (LIPITOR) 10 MG tablet Take 10 mg by mouth daily.  . [DISCONTINUED] metoprolol tartrate (LOPRESSOR) 50 MG tablet Take 1.5 tablets (75 mg total) by mouth 2 (two) times daily.     Allergies:     Patient has no known allergies.   Social History: Social History   Socioeconomic History  . Marital status: Married    Spouse name: Not on file  . Number of children: 2  . Years of education: Not on file  . Highest education level: Not on file  Occupational History  . Not on file  Tobacco Use  . Smoking status: Never Smoker  . Smokeless tobacco: Never Used  Substance and Sexual Activity  . Alcohol use: Not Currently  . Drug use: Not Currently  . Sexual activity: Not on file  Other Topics Concern  . 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 Difficulty of Paying Living Expenses:   Food Insecurity:   . Worried About Programme researcher, broadcasting/film/videounning Out of Food in the Last Year:   . Baristaan Out of Food in the Last Year:   Transportation Needs:   . Freight forwarderLack of Transportation (Medical):   Marland Kitchen. Lack of Transportation (Non-Medical):   Physical Activity:   . Days of Exercise per Week:   . Minutes of Exercise per Session:   Stress:   . Feeling of Stress :   Social Connections:   . Frequency of Communication with Friends and Family:   . Frequency of Social Gatherings with Friends and Family:   . Attends Religious Services:   . Active Member of Clubs or Organizations:   . Attends BankerClub or Organization Meetings:   Marland Kitchen. Marital Status:      Family History: The patient's family history includes Atrial fibrillation in his father; Cardiomyopathy in his father; Diabetes in his paternal grandmother; Heart disease in his father. There is no history of Sudden death.  ROS:   All other ROS reviewed and negative. Pertinent positives noted in the HPI.     EKGs/Labs/Other Studies Reviewed:   The following studies were personally reviewed by me today:  EKG:  EKG is ordered today.  The ekg ordered today demonstrates normal sinus rhythm,  heart rate 87, no acute ST-T changes, no evidence of prior heart blood on the mid if he will he the was with me, and was personally reviewed by me.   TTE 05/28/2019 1. Left ventricular ejection fraction, by estimation, is 40 to 45%. The  left ventricle has mildly decreased function. The left ventricle  demonstrates global hypokinesis. The left ventricular internal cavity size  was moderately to severely dilated. Left  ventricular diastolic parameters are consistent with Grade I diastolic  dysfunction (impaired relaxation).  2. Right ventricular systolic function is normal. The right ventricular  size is normal.  3. The mitral valve is normal in structure. Mild mitral valve  regurgitation.  4. The aortic valve is normal in structure. Aortic valve regurgitation is  mild.  5. Aortic dilatation noted. There is mild dilatation of the ascending  aorta measuring 40 mm.   IMPRESSION: 1. Coronary calcium score of 34. This was 66th percentile for age and sex matched controls.  2. Normal coronary origin with right dominance.  3. Severe mid LAD non-calcified plaque (70-99%).  4. Small PFO.  5. 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 Tricuspid aortic valve noted.  RECOMMENDATIONS: 1. Severe (70-99%) mid LAD stenosis. CT FFR will be submitted. Consider symptom-guided anti-ischemic pharmacotherapy as well as risk factor modification per guideline directed care. Invasive coronary angiography is recommended with revascularization per published guideline statements.  CTFFR   IMPRESSION: 1. Mid LAD lesion is positive for obstructive CAD by CT FFR (0.68).     Recent Labs: 05/06/2019: ALT 25; B Natriuretic Peptide 58.5; Hemoglobin 17.2; Magnesium 2.1; Platelets 288; TSH 2.463 06/18/2019: BUN 18; Creatinine, Ser 1.36; Potassium 4.7; Sodium 141   Recent Lipid Panel    Component Value Date/Time   CHOL 175 02/28/2012 1027   TRIG 176.0 (H) 02/28/2012 1027   HDL  39.30 02/28/2012 1027   CHOLHDL 4 02/28/2012 1027   VLDL 35.2 02/28/2012 1027   LDLCALC 101 (H) 02/28/2012 1027   LDLDIRECT 121.5 07/03/2009 0924    Physical Exam:   VS:  BP 124/78   Pulse 87   Temp (!) 96.9 F (36.1 C)   Ht 6\' 3"  (1.905 m)   Wt (!) 330 lb 12.8 oz (150 kg)   SpO2 94%   BMI 41.35 kg/m    Wt Readings from Last 3 Encounters:  06/30/19 (!) 330 lb 12.8 oz (150 kg)  06/02/19 (!) 335 lb (152 kg)  05/07/19 (!) 324 lb (147 kg)    General: Well nourished, well developed, in no acute distress Heart: Atraumatic, normal size  Eyes: PEERLA, EOMI  Neck: Supple, no JVD Endocrine: No thryomegaly Cardiac: Normal S1, S2; RRR; no murmurs, rubs, or gallops Lungs: Clear to auscultation bilaterally, no wheezing, rhonchi or rales  Abd: Soft, nontender, no hepatomegaly  Ext: No edema, pulses 2+ Musculoskeletal: No deformities, BUE and BLE strength normal and equal Skin: Warm and dry, no rashes   Neuro: Alert and oriented to person, place, time, and situation, CNII-XII grossly intact, no focal deficits  Psych: Normal mood and affect   ASSESSMENT:   Nicholas Macdonald is a 64 y.o. male who presents for the following: 1. Coronary artery disease involving native coronary artery of native heart without angina pectoris   2. Aortic root aneurysm ([place])   3. Asymptomatic LV dysfunction    PLAN:   1. Coronary artery disease involving native coronary artery of native heart without angina pectoris -Recent coronary CTA with severe stenosis 70-99% in the mid lad.  CT FFR was positive as well.  Surprisingly he has no symptoms.  EKG shows normal sinus rhythm no acute ischemic changes no evidence of prior infarction.  Given the results of the ischemia trial I have recommended medical therapy.  Even if he had symptoms I would recommend this as well.  We will add aspirin 81 mg daily.  We will increase his Lipitor to 40 mg daily.  He will need to bring the results of his cholesterol profile to her  next visit.  We also need to keep an eye  on his diabetes.  He is on Jardiance 10 mg daily which is the cardiovascular protective dose.  We will increase his metoprolol succinate to 100 mg twice daily.  We'll give him a prescription for sublingual nitroglycerin.  I would like for him to increase his activity level.  He has been taking it easy since his visits with Dr. Johney Frame.  I would like to see if he actually truly has symptoms or not.  Currently it is unclear if he has symptoms.  Regardless we will treat this medically first before we pursued revascularization.  He is in agreement with this plan.  I will see him back in 1 month to reassess symptoms.  2. Aortic root aneurysm (HCC) -Aortic root 06/17/2019 demonstrates 46 mm.  Is limited to the root.  No ascending aortic aneurysm.  This is an aortic root aneurysm.  We will plan to repeat a CT study in 6 months.  We will then follow that yearly.  3. Asymptomatic LV dysfunction -Ejection fraction 40-45%.  No regional wall motion abnormalities.  No real symptoms from this.  Reports has no shortness of breath or lower extremity edema.  We'll going to add losartan 25 mg daily just to preemptively get on top of this.  There is no indication to treat this at this time.  There is no indication for Entresto.   Disposition: Return in about 1 month (around 07/30/2019).  Medication Adjustments/Labs and Tests Ordered: Current medicines are reviewed at length with the patient today.  Concerns regarding medicines are outlined above.  Orders Placed This Encounter  Procedures  . EKG 12-Lead   Meds ordered this encounter  Medications  . aspirin EC 81 MG tablet    Sig: Take 1 tablet (81 mg total) by mouth daily.    Dispense:  90 tablet    Refill:  3  . nitroGLYCERIN (NITROSTAT) 0.4 MG SL tablet    Sig: Place 1 tablet (0.4 mg total) under the tongue every 5 (five) minutes as needed for chest pain.    Dispense:  90 tablet    Refill:  3  . metoprolol succinate  (TOPROL-XL) 100 MG 24 hr tablet    Sig: Take 1 tablet (100 mg total) by mouth in the morning and at bedtime. Take with or immediately following a meal.    Dispense:  90 tablet    Refill:  3  . atorvastatin (LIPITOR) 40 MG tablet    Sig: Take 1 tablet (40 mg total) by mouth daily.    Dispense:  90 tablet    Refill:  1  . losartan (COZAAR) 25 MG tablet    Sig: Take 1 tablet (25 mg total) by mouth daily.    Dispense:  90 tablet    Refill:  3    Patient Instructions  Medication Instructions:  Start Aspirin 81 mg daily Take Nitroglycerin as needed for chest pains Start Metoprolol Succinate 100 mg twice daily  Increase Lipitor to 40 mg daily  Start Losartan 25 mg daily   *If you need a refill on your cardiac medications before your next appointment, please call your pharmacy*    Follow-Up: At [facility], you and your health needs are our priority.  As part of our continuing mission to provide you with exceptional heart care, we have created designated Provider Care Teams.  These Care Teams include your primary Cardiologist (physician) and Advanced Practice Providers (APPs -  Physician Assistants and Nurse Practitioners) who all work together to provide you with  the care you need, when you need it.  We recommend signing up for the patient portal called "MyChart".  Sign up information is provided on this After Visit Summary.  MyChart is used to connect with patients for Virtual Visits (Telemedicine).  Patients are able to view lab/test results, encounter notes, upcoming appointments, etc.  Non-urgent messages can be sent to your provider as well.   To learn more about what you can do with MyChart, go to [email or web address].    Your next appointment:   1 month(s)  The format for your next appointment:   In Person  Provider:   Lennie Odor, MD        Signed, Lenna Gilford. Flora Lipps, MD [facility]  [address], [address] [place], [place] [address] [phone]  06/30/2019 11:54 AM

## 2019-06-30 ENCOUNTER — Encounter: Payer: Self-pay | Admitting: Cardiovascular Disease

## 2019-06-30 ENCOUNTER — Ambulatory Visit: Payer: Managed Care, Other (non HMO) | Admitting: Cardiovascular Disease

## 2019-06-30 ENCOUNTER — Other Ambulatory Visit: Payer: Self-pay

## 2019-06-30 VITALS — BP 124/78 | HR 87 | Temp 96.9°F | Ht 75.0 in | Wt 330.8 lb

## 2019-06-30 DIAGNOSIS — I519 Heart disease, unspecified: Secondary | ICD-10-CM

## 2019-06-30 DIAGNOSIS — I719 Aortic aneurysm of unspecified site, without rupture: Secondary | ICD-10-CM

## 2019-06-30 DIAGNOSIS — I2510 Atherosclerotic heart disease of native coronary artery without angina pectoris: Secondary | ICD-10-CM

## 2019-06-30 DIAGNOSIS — Q2543 Congenital aneurysm of aorta: Secondary | ICD-10-CM

## 2019-06-30 DIAGNOSIS — I7121 Aneurysm of the ascending aorta, without rupture: Secondary | ICD-10-CM

## 2019-06-30 MED ORDER — NITROGLYCERIN 0.4 MG SL SUBL
0.4000 mg | SUBLINGUAL_TABLET | SUBLINGUAL | 3 refills | Status: DC | PRN
Start: 2019-06-30 — End: 2022-04-01

## 2019-06-30 MED ORDER — ASPIRIN EC 81 MG PO TBEC: 81.0000 mg | DELAYED_RELEASE_TABLET | Freq: Every day | ORAL | 3 refills | Status: AC

## 2019-06-30 MED ORDER — ATORVASTATIN CALCIUM 40 MG PO TABS: 40.0000 mg | ORAL_TABLET | Freq: Every day | ORAL | 1 refills | Status: DC

## 2019-06-30 MED ORDER — METOPROLOL SUCCINATE ER 100 MG PO TB24
100.0000 mg | ORAL_TABLET | Freq: Two times a day (BID) | ORAL | 3 refills | Status: DC
Start: 2019-06-30 — End: 2020-07-13

## 2019-06-30 MED ORDER — LOSARTAN POTASSIUM 25 MG PO TABS: 25.0000 mg | ORAL_TABLET | Freq: Every day | ORAL | 3 refills | Status: AC

## 2019-06-30 NOTE — Patient Instructions (Signed)
Medication Instructions:  Start Aspirin 81 mg daily Take Nitroglycerin as needed for chest pains Start Metoprolol Succinate 100 mg twice daily  Increase Lipitor to 40 mg daily  Start Losartan 25 mg daily   *If you need a refill on your cardiac medications before your next appointment, please call your pharmacy*    Follow-Up: At [facility], you and your health needs are our priority.  As part of our continuing mission to provide you with exceptional heart care, we have created designated Provider Care Teams.  These Care Teams include your primary Cardiologist (physician) and Advanced Practice Providers (APPs -  Physician Assistants and Nurse Practitioners) who all work together to provide you with the care you need, when you need it.  We recommend signing up for the patient portal called "MyChart".  Sign up information is provided on this After Visit Summary.  MyChart is used to connect with patients for Virtual Visits (Telemedicine).  Patients are able to view lab/test results, encounter notes, upcoming appointments, etc.  Non-urgent messages can be sent to your provider as well.   To learn more about what you can do with MyChart, go to [email or web address].    Your next appointment:   1 month(s)  The format for your next appointment:   In Person  Provider:   Lennie Odor, MD

## 2019-07-19 ENCOUNTER — Ambulatory Visit: Payer: Managed Care, Other (non HMO) | Admitting: Cardiovascular Disease

## 2019-07-28 NOTE — Progress Notes (Signed)
Cardiology Office Note:   Date:  07/29/2019  NAME:  Nicholas Macdonald    MRN: [id number] DOB:  01-Sep-1955   PCP:  Creola Corn, MD  Cardiologist:  Reatha Harps, MD   Referring MD: Creola Corn, MD   Chief Complaint  Patient presents with  . Coronary Artery Disease   History of Present Illness:   Nicholas Macdonald is a 64 y.o. 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 Recent evaluation for CAD. No symptoms. We opted for medical management. We discussed need for sleep apnea testing. He is to bring his cholesterol levels.   He reports he is doing well since her last visit.  Has not started exercise.  He denies any chest pain or shortness of breath with his current level activity.  This includes doing things around the house and working.  His blood pressure is much better today on losartan.  He reports his palpitations have resolved on the metoprolol.  His weights are stable.  He is getting good sleep and does not report excess fatigue.  He apparently is monitoring his sleep with a Statistician.  He does have several questions regarding his CT scan.  He does question if this is possibly a false positive.  We did discuss that it is not unusual to not have symptoms with obstructive CAD.  He seems to be doing well on medication.  This also will help him to be less likely to experience symptoms.  His most recent LDL cholesterol is 105.  We did increase his Lipitor to 40 mg at her last visit.  We will need to recheck it.  He also will need a repeat CT scan of his aorta in 6 months.  He has no symptoms of heart failure.  He denies any lower extremity edema.  Overall seems to be doing well.  Just needs get more active.  Problem List 1. PACs 2. 1 vessel CAD -70-99% mid LAD (CT FFR 0.68) 3. 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 Subclinical LV dysfunction -EF 40-45%  Past Medical History: Past Medical History:    Diagnosis Date  . Diabetes mellitus without complication (HCC)   . Diverticulosis   . Essential hypertension   . Gout   . HYPERGLYCEMIA 11/11/2006  . Hyperlipidemia   . MIGRAINE, COMMON 02/27/2009  . NEPHROLITHIASIS, HX OF 11/11/2006  . Overweight   . PERIPHERAL NEUROPATHY 11/10/2009  . Premature atrial contractions     Past Surgical History: Past Surgical History:  Procedure Laterality Date  . TONSILLECTOMY    . TRACHEOSTOMY     croup    Current Medications: Current Meds  Medication Sig  . allopurinol (ZYLOPRIM) 300 MG tablet TAKE ONE TABLET BY MOUTH ONCE DAILY  . aspirin EC 81 MG tablet Take 1 tablet (81 mg total) by mouth daily.  Marland Kitchen atorvastatin (LIPITOR) 40 MG tablet Take 1 tablet (40 mg total) by mouth daily.  Marland Kitchen JARDIANCE 10 MG TABS tablet Take 10 mg by mouth daily.  Marland Kitchen losartan (COZAAR) 25 MG tablet Take 1 tablet (25 mg total) by mouth daily.  . metFORMIN (GLUCOPHAGE) 500 MG tablet Take 500 mg by mouth daily.  . metoprolol succinate (TOPROL-XL) 100 MG 24 hr tablet Take 1 tablet (100 mg total) by mouth in the morning and at bedtime. Take with or immediately following a meal.  . nitroGLYCERIN (NITROSTAT) 0.4 MG SL tablet Place 1 tablet (0.4 mg  total) under the tongue every 5 (five) minutes as needed for chest pain.  Marland Kitchen OZEMPIC, 1 MG/DOSE, 2 MG/1.5ML SOPN Inject 1 mg into the skin once a week. Thursday     Allergies:    Patient has no known allergies.   Social History: Social History   Socioeconomic History  . Marital status: Married    Spouse name: Not on file  . Number of children: 2  . Years of education: Not on file  . Highest education level: Not on file  Occupational History  . Not on file  Tobacco Use  . Smoking status: Never Smoker  . Smokeless tobacco: Never Used  Substance and Sexual Activity  . Alcohol use: Not Currently  . Drug use: Not Currently  . Sexual activity: Not on file  Other Topics Concern  . Not on file  Social History Narrative    Lives in [facility]   He works as a Building services engineer for [employer]   Owns an Radio producer company   Social Determinants of Corporate investment banker Strain:   . Difficulty of Paying Living Expenses:   Food Insecurity:   . Worried About Programme researcher, broadcasting/film/video in the Last Year:   . Barista in the Last Year:   Transportation Needs:   . Freight forwarder (Medical):   Marland Kitchen Lack of Transportation (Non-Medical):   Physical Activity:   . Days of Exercise per Week:   . Minutes of Exercise per Session:   Stress:   . Feeling of Stress :   Social Connections:   . Frequency of Communication with Friends and Family:   . Frequency of Social Gatherings with Friends and Family:   . Attends Religious Services:   . Active Member of Clubs or Organizations:   . Attends Banker Meetings:   Marland Kitchen Marital Status:      Family History: The patient's family history includes Atrial fibrillation in his father; Cardiomyopathy in his father; Diabetes in his paternal grandmother; Heart disease in his father. There is no history of Sudden death.  ROS:   All other ROS reviewed and negative. Pertinent positives noted in the HPI.     EKGs/Labs/Other Studies Reviewed:   The following studies were personally reviewed by me today:  Echo 05/28/2019 1. Left ventricular ejection fraction, by estimation, is 40 to 45%. The  left ventricle has mildly decreased function. The left ventricle  demonstrates global hypokinesis. The left ventricular internal cavity size  was moderately to severely dilated. Left  ventricular diastolic parameters are consistent with Grade I diastolic  dysfunction (impaired relaxation).  2. Right ventricular systolic function is normal. The right ventricular  size is normal.  3. The mitral valve is normal in structure. Mild mitral valve  regurgitation.  4. The aortic valve is normal in structure. Aortic valve regurgitation is  mild.  5.  Aortic dilatation noted. There is mild dilatation of the ascending  aorta measuring 40 mm.   CCTA 06/24/2019 IMPRESSION: 1. Coronary calcium score of 34. This was 66th percentile for age and sex matched controls.  2. Normal coronary origin with right dominance.  3. Severe mid LAD non-calcified plaque (70-99%).  4. Small PFO.  5. 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 Tricuspid aortic valve noted.  CT FFR 0.68  Recent Labs: 05/06/2019: ALT 25; B Natriuretic Peptide 58.5; Hemoglobin 17.2; Magnesium 2.1; Platelets 288; TSH 2.463 06/18/2019: BUN 18; Creatinine,  Ser 1.36; Potassium 4.7; Sodium 141   Recent Lipid Panel    Component Value Date/Time   CHOL 175 02/28/2012 1027   TRIG 176.0 (H) 02/28/2012 1027   HDL 39.30 02/28/2012 1027   CHOLHDL 4 02/28/2012 1027   VLDL 35.2 02/28/2012 1027   LDLCALC 101 (H) 02/28/2012 1027   LDLDIRECT 121.5 07/03/2009 0924    Physical Exam:   VS:  BP 132/72   Pulse 89   Ht 6\' 3"  (1.905 m)   Wt (!) 331 lb 3.2 oz (150.2 kg)   SpO2 95%   BMI 41.40 kg/m    Wt Readings from Last 3 Encounters:  07/29/19 (!) 331 lb 3.2 oz (150.2 kg)  06/30/19 (!) 330 lb 12.8 oz (150 kg)  06/02/19 (!) 335 lb (152 kg)    General: Well nourished, well developed, in no acute distress Heart: Atraumatic, normal size  Eyes: PEERLA, EOMI  Neck: Supple, no JVD Endocrine: No thryomegaly Cardiac: Normal S1, S2; RRR; no murmurs, rubs, or gallops Lungs: Clear to auscultation bilaterally, no wheezing, rhonchi or rales  Abd: Soft, nontender, no hepatomegaly  Ext: No edema, pulses 2+ Musculoskeletal: No deformities, BUE and BLE strength normal and equal Skin: Warm and dry, no rashes   Neuro: Alert and oriented to person, place, time, and situation, CNII-XII grossly intact, no focal deficits  Psych: Normal mood and affect   ASSESSMENT:   Nicholas Macdonald is a 64 y.o. male who presents for the following: 1. Coronary artery disease involving native  coronary artery of native heart without angina pectoris   2. Aortic root aneurysm (HCC)   3. Asymptomatic LV dysfunction   4. Premature atrial contraction     PLAN:   1. Coronary artery disease involving native coronary artery of native heart without angina pectoris -1 vessel CAD; 70-99% mid LAD (CT FFR 0.68) -No symptoms of angina.  No significant disease in other vessels.  We have opted for medical management at this time.  We will continue aspirin and Lipitor 40 mg daily.  He will recheck lipid profile next week.  Goal LDL cholesterol is less than 70.  We will continue metoprolol succinate 100 mg twice daily. -He will continue to work on his diabetes. -Activity and exercise have been recommended. -He has a prescription for nitroglycerin.  Strict return precautions noted.  He will let 64 know how he does.  2. Aortic root aneurysm (HCC) -Aortic root 46 mm on recent CT scan.  He will need a gated CT study in November or December of this year.  I will see him back in 5 months we will discuss it then.  3. Asymptomatic LV dysfunction -EF 40-45%.  No real symptoms of heart failure.  He is on metoprolol succinate as well as losartan.  We will continue this.  He will let January know if he has further symptoms.  4. Premature atrial contraction -No further palpitations on metoprolol.  Continue this.  Disposition: Return in about 5 months (around 12/29/2019).  Medication Adjustments/Labs and Tests Ordered: Current medicines are reviewed at length with the patient today.  Concerns regarding medicines are outlined above.  Orders Placed This Encounter  Procedures  . Lipid panel   No orders of the defined types were placed in this encounter.   Patient Instructions  Medication Instructions:  The current medical regimen is effective;  continue present plan and medications.  *If you need a refill on your cardiac medications before your next appointment, please call your pharmacy*  Lab  Work: LIPID (next week, come fasting- nothing to eat or drink, no appointment needed)  If you have labs (blood work) drawn today and your tests are completely normal, you will receive your results only by: Marland Kitchen. MyChart Message (if you have MyChart) OR . A paper copy in the mail If you have any lab test that is abnormal or we need to change your treatment, we will call you to review the results.   Follow-Up: At [facility], you and your health needs are our priority.  As part of our continuing mission to provide you with exceptional heart care, we have created designated Provider Care Teams.  These Care Teams include your primary Cardiologist (physician) and Advanced Practice Providers (APPs -  Physician Assistants and Nurse Practitioners) who all work together to provide you with the care you need, when you need it.  We recommend signing up for the patient portal called "MyChart".  Sign up information is provided on this After Visit Summary.  MyChart is used to connect with patients for Virtual Visits (Telemedicine).  Patients are able to view lab/test results, encounter notes, upcoming appointments, etc.  Non-urgent messages can be sent to your provider as well.   To learn more about what you can do with MyChart, go to [email or web address].    Your next appointment:   5 month(s)  The format for your next appointment:   In Person  Provider:   Lennie OdorWesley O'Neal, MD        Time Spent with Patient: I have spent a total of 35 minutes with patient reviewing hospital notes, telemetry, EKGs, labs and examining the patient as well as establishing an assessment and plan that was discussed with the patient.  > 50% of time was spent in direct patient care.  Signed, Lenna GilfordWesley T. Flora Lipps'Neal, MD [facility]  [facility]  [address] [place], [place] [address] [phone]  07/29/2019 11:47 AM

## 2019-07-29 ENCOUNTER — Other Ambulatory Visit: Payer: Self-pay

## 2019-07-29 ENCOUNTER — Ambulatory Visit: Payer: Managed Care, Other (non HMO) | Admitting: Cardiovascular Disease

## 2019-07-29 ENCOUNTER — Encounter: Payer: Self-pay | Admitting: Cardiovascular Disease

## 2019-07-29 VITALS — BP 132/72 | HR 89 | Ht 75.0 in | Wt 331.2 lb

## 2019-07-29 DIAGNOSIS — I491 Atrial premature depolarization: Secondary | ICD-10-CM

## 2019-07-29 DIAGNOSIS — I719 Aortic aneurysm of unspecified site, without rupture: Secondary | ICD-10-CM

## 2019-07-29 DIAGNOSIS — I7121 Aneurysm of the ascending aorta, without rupture: Secondary | ICD-10-CM

## 2019-07-29 DIAGNOSIS — I519 Heart disease, unspecified: Secondary | ICD-10-CM

## 2019-07-29 DIAGNOSIS — I2510 Atherosclerotic heart disease of native coronary artery without angina pectoris: Secondary | ICD-10-CM | POA: Diagnosis not present

## 2019-07-29 NOTE — Patient Instructions (Signed)
Medication Instructions:  The current medical regimen is effective;  continue present plan and medications.  *If you need a refill on your cardiac medications before your next appointment, please call your pharmacy*   Lab Work: LIPID (next week, come fasting- nothing to eat or drink, no appointment needed)  If you have labs (blood work) drawn today and your tests are completely normal, you will receive your results only by: Marland Kitchen MyChart Message (if you have MyChart) OR . A paper copy in the mail If you have any lab test that is abnormal or we need to change your treatment, we will call you to review the results.   Follow-Up: At [facility], you and your health needs are our priority.  As part of our continuing mission to provide you with exceptional heart care, we have created designated Provider Care Teams.  These Care Teams include your primary Cardiologist (physician) and Advanced Practice Providers (APPs -  Physician Assistants and Nurse Practitioners) who all work together to provide you with the care you need, when you need it.  We recommend signing up for the patient portal called "MyChart".  Sign up information is provided on this After Visit Summary.  MyChart is used to connect with patients for Virtual Visits (Telemedicine).  Patients are able to view lab/test results, encounter notes, upcoming appointments, etc.  Non-urgent messages can be sent to your provider as well.   To learn more about what you can do with MyChart, go to [email or web address].    Your next appointment:   5 month(s)  The format for your next appointment:   In Person  Provider:   Lennie Odor, MD

## 2019-07-30 LAB — LIPID PANEL
Chol/HDL Ratio: 2.6 ratio (ref 0.0–5.0)
Cholesterol, Total: 108 mg/dL (ref 100–199)
HDL: 42 mg/dL (ref >39)
LDL Chol Calc (NIH): 45 mg/dL (ref 0–99)
Triglycerides: 117 mg/dL (ref 0–149)
VLDL Cholesterol Cal: 21 mg/dL (ref 5–40)

## 2019-12-20 ENCOUNTER — Other Ambulatory Visit: Payer: Self-pay | Admitting: Cardiovascular Disease

## 2019-12-29 NOTE — Progress Notes (Signed)
Cardiology Office Note:   Date:  12/31/2019  NAME:  Nicholas Macdonald    MRN: [id number] DOB:  Jul 12, 1955   PCP:  Creola Corn, MD  Cardiologist:  Reatha Harps, MD   Referring MD: Creola Corn, MD   Chief Complaint  Patient presents with  . Follow-up    5 months.   History of Present Illness:   Nicholas Macdonald is a 64 y.o. male with a hx of PACs, CAD, aortic root aneurysm, subclinical LV dysfunction who presents for follow-up. LDL at goal. Needs repeat CTA of aortic root. He reports he is doing well.  No symptoms of angina.  He is not exercising due to back pain.  Weight has remained unchanged.  Most recent LDL cholesterol is at goal.  He reports no issues with palpitations or rapid heartbeat sensation.  There is no sleep apnea per his report.  He is not excessively tired or fatigued.  His most recent A1c was 6.6.  He is on Ozempic, Jardiance and Metformin.  All medications have heart protective benefits.  His blood pressure is 112/76.  He overall is doing well but has not lost weight.  He has no symptoms of angina.  He is tolerating medical therapy well for CAD.  We did discuss that he needs repeat evaluation of his aortic root.  This was noted to be aneurysmal on his CTA.  This did match up with his recent echocardiogram and we will plan to repeat that.  Problem List 1. PACs 2. 1 vessel CAD -70-99% mid LAD (CT FFR 0.68) 3. HLD -T chol 108, HDL 42, LDL 45, TG 117 4. 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 Diabetes mellitus without complication (HCC)   . Diverticulosis   . Essential hypertension   . Gout   . HYPERGLYCEMIA 11/11/2006  . Hyperlipidemia   . MIGRAINE, COMMON 02/27/2009  . NEPHROLITHIASIS, HX OF 11/11/2006  . Overweight   . PERIPHERAL NEUROPATHY 11/10/2009  . Premature atrial contractions     Past Surgical History: Past Surgical History:    Procedure Laterality Date  . TONSILLECTOMY    . TRACHEOSTOMY     croup    Current Medications: Current Meds  Medication Sig  . allopurinol (ZYLOPRIM) 300 MG tablet TAKE ONE TABLET BY MOUTH ONCE DAILY  . aspirin EC 81 MG tablet Take 1 tablet (81 mg total) by mouth daily.  Marland Kitchen atorvastatin (LIPITOR) 40 MG tablet TAKE 1 TABLET (40 MG TOTAL) BY MOUTH DAILY.  Marland Kitchen JARDIANCE 10 MG TABS tablet Take 10 mg by mouth daily.  Marland Kitchen losartan (COZAAR) 25 MG tablet Take 1 tablet (25 mg total) by mouth daily.  . metFORMIN (GLUCOPHAGE) 500 MG tablet Take 500 mg by mouth daily.  . metoprolol succinate (TOPROL-XL) 100 MG 24 hr tablet Take 1 tablet (100 mg total) by mouth in the morning and at bedtime. Take with or immediately following a meal.  . nitroGLYCERIN (NITROSTAT) 0.4 MG SL tablet Place 1 tablet (0.4 mg total) under the tongue every 5 (five) minutes as needed for chest pain.  Marland Kitchen OZEMPIC, 1 MG/DOSE, 2 MG/1.5ML SOPN Inject 1 mg into the skin once a week. Thursday     Allergies:    Patient has no known allergies.   Social History: Social History   Socioeconomic History  . Marital status: Married    Spouse name: Not on file  . Number  of children: 2  . Years of education: Not on file  . Highest education level: Not on file  Occupational History  . Not on file  Tobacco Use  . Smoking status: Never Smoker  . Smokeless tobacco: Never Used  Substance and Sexual Activity  . Alcohol use: Not Currently  . Drug use: Not Currently  . Sexual activity: Not on file  Other Topics Concern  . 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 Difficulty of Paying Living Expenses: Not on file  Food Insecurity:   . Worried About Programme researcher, broadcasting/film/videounning Out of Food in the Last Year: Not on file  . Ran Out of Food in the Last Year: Not on file   Transportation Needs:   . Lack of Transportation (Medical): Not on file  . Lack of Transportation (Non-Medical): Not on file  Physical Activity:   . Days of Exercise per Week: Not on file  . Minutes of Exercise per Session: Not on file  Stress:   . Feeling of Stress : Not on file  Social Connections:   . Frequency of Communication with Friends and Family: Not on file  . Frequency of Social Gatherings with Friends and Family: Not on file  . Attends Religious Services: Not on file  . Active Member of Clubs or Organizations: Not on file  . Attends BankerClub or Organization Meetings: Not on file  . Marital Status: Not on file     Family History: The patient's family history includes Atrial fibrillation in his father; Cardiomyopathy in his father; Diabetes in his paternal grandmother; Heart disease in his father. There is no history of Sudden death.  ROS:   All other ROS reviewed and negative. Pertinent positives noted in the HPI.     EKGs/Labs/Other Studies Reviewed:   The following studies were personally reviewed by me today:  CCTA 06/24/2019 1. Coronary calcium score of 34. This was 66th percentile for age and sex matched controls.  2. Normal coronary origin with right dominance.  3. Severe mid LAD non-calcified plaque (70-99%).  4. Small PFO.  5. 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 Tricuspid aortic valve noted.  RECOMMENDATIONS: 1. Severe (70-99%) mid LAD stenosis. CT FFR will be submitted. Consider symptom-guided anti-ischemic pharmacotherapy as well as risk factor modification per guideline directed care. Invasive coronary angiography is recommended with revascularization per published guideline statements  TTE 05/28/2019 1. Left ventricular ejection fraction, by estimation, is 40 to 45%. The  left ventricle has mildly decreased function. The left ventricle  demonstrates global hypokinesis. The left ventricular internal cavity size  was  moderately to severely dilated. Left  ventricular diastolic parameters are consistent with Grade I diastolic  dysfunction (impaired relaxation).  2. Right ventricular systolic function is normal. The right ventricular  size is normal.  3. The mitral valve is normal in structure. Mild mitral valve  regurgitation.  4. The aortic valve is normal in structure. Aortic valve regurgitation is  mild.  5. Aortic dilatation noted. There is mild dilatation of the ascending  aorta measuring 40 mm.     Recent Labs: 05/06/2019: ALT 25; B Natriuretic Peptide 58.5; Hemoglobin 17.2; Magnesium 2.1; Platelets 288; TSH 2.463 06/18/2019: BUN 18; Creatinine, Ser 1.36; Potassium 4.7; Sodium 141  Recent Lipid Panel    Component Value Date/Time   CHOL 108 07/30/2019 0934   TRIG 117 07/30/2019 0934   HDL 42 07/30/2019 0934   CHOLHDL 2.6 07/30/2019 0934   CHOLHDL 4 02/28/2012 1027   VLDL 35.2 02/28/2012 1027   LDLCALC 45 07/30/2019 0934   LDLDIRECT 121.5 07/03/2009 0924    Physical Exam:   VS:  BP 112/76 (BP Location: Left Arm, Patient Position: Sitting, Cuff Size: Large)   Pulse 81   Ht 6\' 4"  (1.93 m)   Wt (!) 333 lb (151 kg)   BMI 40.53 kg/m    Wt Readings from Last 3 Encounters:  12/31/19 (!) 333 lb (151 kg)  07/29/19 (!) 331 lb 3.2 oz (150.2 kg)  06/30/19 (!) 330 lb 12.8 oz (150 kg)    General: Well nourished, well developed, in no acute distress Heart: Atraumatic, normal size  Eyes: PEERLA, EOMI  Neck: Supple, no JVD Endocrine: No thryomegaly Cardiac: Normal S1, S2; RRR; no murmurs, rubs, or gallops Lungs: Clear to auscultation bilaterally, no wheezing, rhonchi or rales  Abd: Soft, nontender, no hepatomegaly  Ext: No edema, pulses 2+ Musculoskeletal: No deformities, BUE and BLE strength normal and equal Skin: Warm and dry, no rashes   Neuro: Alert and oriented to person, place, time, and situation, CNII-XII grossly intact, no focal deficits  Psych: Normal mood and affect    ASSESSMENT:   EARLIE SCHANK is a 64 y.o. male who presents for the following: 1. Coronary artery disease involving native coronary artery of native heart without angina pectoris   2. Mixed hyperlipidemia   3. Asymptomatic LV dysfunction   4. Aortic root aneurysm (HCC)   5. Premature atrial contraction     PLAN:   1. Coronary artery disease involving native coronary artery of native heart without angina pectoris -70-99% mid LAD (CT FFR 0.68) -No symptoms of angina.  He opted for medical therapy.  He is on aspirin 81 mg a day and Lipitor 40 mg a day.  LDL cholesterol is at goal. -We have continued her mom metoprolol succinate 100 mg a day.  No symptoms of angina.  We will continue with medical therapy. -He is on Jardiance and a GLP-1 agonist  2. Mixed hyperlipidemia -LDL at goal.  Continue Lipitor.  3. Asymptomatic LV dysfunction -EF was reported as 40-45%.  Global hypokinesis.  No symptoms and he was started on metoprolol succinate and losartan.  We will plan to recheck an echocardiogram and also look at the aortic root as discussed below.  4. Aortic root aneurysm (HCC) -CTA demonstrates aortic root up to 4.6 cm.  Echo showed 4.4 cm.  We will plan to repeat an echocardiogram to see if there is any change.  If no change we will repeat annually.  5. Premature atrial contraction -PACs are controlled metoprolol.  No concern for sleep apnea.  No atrial fibrillation documented.  Disposition: Return in about 6 months (around 06/30/2020).  Medication Adjustments/Labs and Tests Ordered: Current medicines are reviewed at length with the patient today.  Concerns regarding medicines are outlined above.  Orders Placed This Encounter  Procedures  . ECHOCARDIOGRAM COMPLETE   No orders of the defined types were placed in this encounter.   Patient Instructions  Medication Instructions:  Your physician recommends that you continue on your current medications as directed. Please refer to  the Current Medication list given to you today.  *If you need a refill on your cardiac medications before your next appointment, please  call your pharmacy*   Lab Work: -None If you have labs (blood work) drawn today and your tests are completely normal, you will receive your results only by: Marland Kitchen MyChart Message (if you have MyChart) OR . A paper copy in the mail If you have any lab test that is abnormal or we need to change your treatment, we will call you to review the results.   Testing/Procedures: Your physician has requested that you have an echocardiogram. Echocardiography is a painless test that uses sound waves to create images of your heart. It provides your doctor with information about the size and shape of your heart and how well your heart's chambers and valves are working. This procedure takes approximately one hour. There are no restrictions for this procedure.     Follow-Up: At [facility], you and your health needs are our priority.  As part of our continuing mission to provide you with exceptional heart care, we have created designated Provider Care Teams.  These Care Teams include your primary Cardiologist (physician) and Advanced Practice Providers (APPs -  Physician Assistants and Nurse Practitioners) who all work together to provide you with the care you need, when you need it.  We recommend signing up for the patient portal called "MyChart".  Sign up information is provided on this After Visit Summary.  MyChart is used to connect with patients for Virtual Visits (Telemedicine).  Patients are able to view lab/test results, encounter notes, upcoming appointments, etc.  Non-urgent messages can be sent to your provider as well.   To learn more about what you can do with MyChart, go to [email or web address].    Your next appointment:   6 month(s)  The format for your next appointment:   In Person  Provider:   Lennie Odor, MD   Other Instructions Your  physician recommends that you keep your scheduled  follow-up appointment with Dr. Flora Lipps on Friday, June 10 @ 11:00 am.       Time Spent with Patient: I have spent a total of 35 minutes with patient reviewing hospital notes, telemetry, EKGs, labs and examining the patient as well as establishing an assessment and plan that was discussed with the patient.  > 50% of time was spent in direct patient care.  Signed, Lenna Gilford. Flora Lipps, MD [facility]  [address] [place], [place] [address] [phone]  12/31/2019 12:31 PM

## 2019-12-31 ENCOUNTER — Encounter: Payer: Self-pay | Admitting: Cardiovascular Disease

## 2019-12-31 ENCOUNTER — Ambulatory Visit (INDEPENDENT_AMBULATORY_CARE_PROVIDER_SITE_OTHER): Payer: Managed Care, Other (non HMO) | Admitting: Cardiovascular Disease

## 2019-12-31 ENCOUNTER — Other Ambulatory Visit: Payer: Self-pay

## 2019-12-31 VITALS — BP 112/76 | HR 81 | Ht 76.0 in | Wt 333.0 lb

## 2019-12-31 DIAGNOSIS — I519 Heart disease, unspecified: Secondary | ICD-10-CM

## 2019-12-31 DIAGNOSIS — I719 Aortic aneurysm of unspecified site, without rupture: Secondary | ICD-10-CM

## 2019-12-31 DIAGNOSIS — I7121 Aneurysm of the ascending aorta, without rupture: Secondary | ICD-10-CM

## 2019-12-31 DIAGNOSIS — I2510 Atherosclerotic heart disease of native coronary artery without angina pectoris: Secondary | ICD-10-CM | POA: Diagnosis not present

## 2019-12-31 DIAGNOSIS — E782 Mixed hyperlipidemia: Secondary | ICD-10-CM | POA: Diagnosis not present

## 2019-12-31 DIAGNOSIS — I491 Atrial premature depolarization: Secondary | ICD-10-CM

## 2019-12-31 NOTE — Patient Instructions (Signed)
Medication Instructions:  Your physician recommends that you continue on your current medications as directed. Please refer to the Current Medication list given to you today.  *If you need a refill on your cardiac medications before your next appointment, please call your pharmacy*   Lab Work: -None If you have labs (blood work) drawn today and your tests are completely normal, you will receive your results only by: Marland Kitchen MyChart Message (if you have MyChart) OR . A paper copy in the mail If you have any lab test that is abnormal or we need to change your treatment, we will call you to review the results.   Testing/Procedures: Your physician has requested that you have an echocardiogram. Echocardiography is a painless test that uses sound waves to create images of your heart. It provides your doctor with information about the size and shape of your heart and how well your heart's chambers and valves are working. This procedure takes approximately one hour. There are no restrictions for this procedure.     Follow-Up: At [facility], you and your health needs are our priority.  As part of our continuing mission to provide you with exceptional heart care, we have created designated Provider Care Teams.  These Care Teams include your primary Cardiologist (physician) and Advanced Practice Providers (APPs -  Physician Assistants and Nurse Practitioners) who all work together to provide you with the care you need, when you need it.  We recommend signing up for the patient portal called "MyChart".  Sign up information is provided on this After Visit Summary.  MyChart is used to connect with patients for Virtual Visits (Telemedicine).  Patients are able to view lab/test results, encounter notes, upcoming appointments, etc.  Non-urgent messages can be sent to your provider as well.   To learn more about what you can do with MyChart, go to [email or web address].    Your next appointment:   6  month(s)  The format for your next appointment:   In Person  Provider:   Lennie Odor, MD   Other Instructions Your physician recommends that you keep your scheduled  follow-up appointment with Dr. Flora Lipps on Friday, June 10 @ 11:00 am.

## 2020-01-26 ENCOUNTER — Ambulatory Visit (HOSPITAL_COMMUNITY): Payer: Managed Care, Other (non HMO) | Attending: Internal Medicine

## 2020-01-26 ENCOUNTER — Other Ambulatory Visit: Payer: Self-pay

## 2020-01-26 DIAGNOSIS — I2510 Atherosclerotic heart disease of native coronary artery without angina pectoris: Secondary | ICD-10-CM | POA: Diagnosis not present

## 2020-01-26 DIAGNOSIS — E782 Mixed hyperlipidemia: Secondary | ICD-10-CM | POA: Diagnosis not present

## 2020-01-26 DIAGNOSIS — Q2543 Congenital aneurysm of aorta: Secondary | ICD-10-CM

## 2020-01-26 DIAGNOSIS — I491 Atrial premature depolarization: Secondary | ICD-10-CM | POA: Diagnosis present

## 2020-01-26 DIAGNOSIS — I519 Heart disease, unspecified: Secondary | ICD-10-CM

## 2020-01-26 DIAGNOSIS — I719 Aortic aneurysm of unspecified site, without rupture: Secondary | ICD-10-CM | POA: Diagnosis not present

## 2020-01-26 DIAGNOSIS — I7121 Aneurysm of the ascending aorta, without rupture: Secondary | ICD-10-CM

## 2020-01-26 LAB — ECHOCARDIOGRAM COMPLETE
Area-P 1/2: 4.39 cm2
P 1/2 time: 423 msec
S' Lateral: 4.1 cm

## 2020-01-26 MED ORDER — PERFLUTREN LIPID MICROSPHERE
1.0000 mL | INTRAVENOUS | Status: AC | PRN
  Administered 2020-01-26: 3 mL via INTRAVENOUS

## 2020-02-28 ENCOUNTER — Other Ambulatory Visit: Payer: Self-pay | Admitting: Cardiovascular Disease

## 2020-05-11 ENCOUNTER — Telehealth: Payer: Self-pay | Admitting: Cardiovascular Disease

## 2020-05-11 ENCOUNTER — Other Ambulatory Visit: Payer: Self-pay | Admitting: Cardiovascular Disease

## 2020-05-11 NOTE — Telephone Encounter (Signed)
Patient c/o Palpitations:  High priority if patient c/o lightheadedness, shortness of breath, or chest pain  1) How long have you had palpitations/irregular HR/ Afib? Are you having the symptoms now?No symptoms now, Started a couple days ago but not having them right now  2) Are you currently experiencing lightheadedness, SOB or CP? No  3) Do you have a history of afib (atrial fibrillation) or irregular heart rhythm? Diagnosed with irregular heart rhythm a year ago by Dr. Scharlene Gloss  4) Have you checked your BP or HR? (document readings if available): yesterday 107/79 HR79             Today 117/79 HR80  5) Are you experiencing any other symptoms? No

## 2020-05-11 NOTE — Telephone Encounter (Signed)
Spoke with patient and since regarding palpitations Per patient he had not been having any issues since last year Has history of migraines which had also improved.  Last few days his visual aura's have returned along with his palpations Episodes are only few seconds long and not sure how often he is having Yesterday he had on visual aura that was so intense caused him to urinate a little When he has these palpitations he does have a rushing sensation in his chest  If heart is beating fast he can not tell  Denies any chest pain or shortness of breath Blood pressure average yesterday 107/76 HR 88, today 112/85 HR 84 Will forward to Dr Flora Lipps for review

## 2020-05-12 ENCOUNTER — Encounter: Payer: Self-pay | Admitting: Radiology

## 2020-05-12 ENCOUNTER — Ambulatory Visit (INDEPENDENT_AMBULATORY_CARE_PROVIDER_SITE_OTHER): Payer: Managed Care, Other (non HMO)

## 2020-05-12 ENCOUNTER — Other Ambulatory Visit: Payer: Self-pay

## 2020-05-12 DIAGNOSIS — R002 Palpitations: Secondary | ICD-10-CM

## 2020-05-12 NOTE — Telephone Encounter (Signed)
Called patient, gave recommendations from MD. Ordered heart monitor.  Patient verbalized understanding.

## 2020-05-12 NOTE — Progress Notes (Signed)
Enrolled patient for a 7 day Zio XT Monitor to be mailed to patients home.  

## 2020-05-12 NOTE — Telephone Encounter (Signed)
Can we set him up for a 1 week Zio patch.  If he notices any numbness or weakness he should present to the emergency room.  Also any slurred speech or strokelike symptoms should prompt emergency room visit.  He may just be having PACs.  If things get worse he will need to be seen in the office by me.  Gerri Spore T. Flora Lipps, MD, [facility]  [facility]  [address] [place], [place] [address] [phone]  7:52 AM

## 2020-05-19 DIAGNOSIS — R002 Palpitations: Secondary | ICD-10-CM | POA: Diagnosis not present

## 2020-07-06 NOTE — Progress Notes (Signed)
Cardiology Office Note:   Date:  07/07/2020  NAME:  Nicholas Macdonald    MRN: [id number] DOB:  1955-07-05   PCP:  Creola Corn, MD  Cardiologist:  Reatha Harps, MD  Electrophysiologist:  None   Referring MD: Creola Corn, MD   Chief Complaint  Patient presents with   Follow-up   History of Present Illness:   Nicholas Macdonald is a 65 y.o. male with a hx of single-vessel CAD, PACs, hyperlipidemia, aortic root aneurysm who presents for follow-up.  He was having palpitations.  We pursued a 1 week Zio patch which showed brief A. tach episodes lasting up to 4.5 seconds.  He had 2 episodes in 7 days.  No treatment was recommended.  He reports he had a flushing sensation in his chest.  This was associated with hyperglycemia.  Symptoms have improved with control of his diabetes.  He has had no chest pressure or chest discomfort.  No shortness of breath.  He has not lost any weight.  He reports he is still trying to do so.  Describes no further palpitations.  He is walking up to 30 minutes/day.  He reports no history of sleep apnea.  We did go over that his cholesterol level is at goal.  BP appears to be stable.  He does have single-vessel obstructive CAD of the mid LAD.  He has no symptoms from this.  We will continue to treat him medically.  We also went over the results of his aortic imaging.  He has an aortic root aneurysm.  I would like to repeat a gated chest CTA in 1 year.  He has a tricuspid aortic valve.  No family history of aortic dissection or aortic disease.    Problem List 1. PACs 2. 1 vessel CAD -70-99% mid LAD (CT FFR 0.68) 3. HLD -T chol 108, HDL 42, LDL 45, TG 117 4. 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 Subclinical LV dysfunction -EF 45-50% 6. DM -A1c 6.4  Past Medical History: Past Medical History:  Diagnosis Date   Diabetes mellitus without complication (HCC)    Diverticulosis    Essential hypertension    Gout    HYPERGLYCEMIA 11/11/2006    Hyperlipidemia    MIGRAINE, COMMON 02/27/2009   NEPHROLITHIASIS, HX OF 11/11/2006   Overweight    PERIPHERAL NEUROPATHY 11/10/2009   Premature atrial contractions     Past Surgical History: Past Surgical History:  Procedure Laterality Date   TONSILLECTOMY     TRACHEOSTOMY     croup    Current Medications: Current Meds  Medication Sig   allopurinol (ZYLOPRIM) 300 MG tablet TAKE ONE TABLET BY MOUTH ONCE DAILY   aspirin EC 81 MG tablet Take 1 tablet (81 mg total) by mouth daily.   atorvastatin (LIPITOR) 40 MG tablet Take 1 tablet (40 mg total) by mouth daily.   JARDIANCE 10 MG TABS tablet Take 10 mg by mouth daily.   metFORMIN (GLUCOPHAGE) 500 MG tablet Take 500 mg by mouth daily.   OZEMPIC, 1 MG/DOSE, 2 MG/1.5ML SOPN Inject 1 mg into the skin once a week. Thursday     Allergies:    Patient has no known allergies.   Social History: Social History   Socioeconomic History   Marital status: Married    Spouse name: Not on file   Number of children: 2   Years of education: Not on file   Highest education level: Not on file  Occupational History   Not  on file  Tobacco Use   Smoking status: Never   Smokeless tobacco: Never  Substance and Sexual Activity   Alcohol use: Not Currently   Drug use: Not Currently   Sexual activity: Not on file  Other Topics Concern   Not on file  Social History Narrative   [place]   Single   CPA   He works as a Building services engineerCFO for [employer]   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 There is no history of Sudden  death.  ROS:   All other ROS reviewed and negative. Pertinent positives noted in the HPI.     EKGs/Labs/Other Studies Reviewed:   The following studies were personally reviewed by me today:   TTE 01/26/2020  1. Left ventricular ejection fraction, by estimation, is 45 to 50%. The  left ventricle has mildly decreased function. The left ventricle  demonstrates global hypokinesis. There is mild concentric left ventricular  hypertrophy. Left ventricular diastolic  parameters are consistent with Grade I diastolic dysfunction (impaired  relaxation).   2. Right ventricular systolic function is normal. The right ventricular  size is normal.   3. The mitral valve is grossly normal. No evidence of mitral valve  regurgitation.   4. The aortic valve is tricuspid. Aortic valve regurgitation is mild. No  aortic stenosis is present. Aortic regurgitation PHT measures 423 msec.   5. Aortic dilatation noted. There is mild dilatation of the aortic root,  measuring 43 mm. There is mild dilatation of the ascending aorta,  measuring 40 mm.   Zio 06/03/2020  Impression: 1. Symptoms coincided with normal sinus rhythm. 2. Brief atrial tachycardia detected that did not coincided with symptoms (2 episodes in 7 days; longest duration 4.5 seconds). 3. Rare ectopy.  Recent Labs: No results found for requested labs within last 8760 hours.   Recent Lipid Panel    Component Value Date/Time   CHOL 108 07/30/2019 0934   TRIG 117 07/30/2019 0934   HDL 42 07/30/2019 0934   CHOLHDL 2.6 07/30/2019 0934   CHOLHDL 4 02/28/2012 1027   VLDL 35.2 02/28/2012 1027   LDLCALC 45 07/30/2019 0934   LDLDIRECT 121.5 07/03/2009 0924    Physical Exam:   VS:  BP 118/86   Pulse 86   Ht 6\' 3"  (1.905 m)   Wt (!) 330 lb (149.7 kg)   SpO2 93%   BMI 41.25 kg/m    Wt Readings from Last 3 Encounters:  07/07/20 (!) 330 lb (149.7 kg)  12/31/19 (!) 333 lb (151 kg)  07/29/19 (!) 331 lb 3.2 oz (150.2 kg)    General: Well  nourished, well developed, in no acute distress Head: Atraumatic, normal size  Eyes: PEERLA, EOMI  Neck: Supple, no JVD Endocrine: No thryomegaly Cardiac: Normal S1, S2; RRR; no murmurs, rubs, or gallops Lungs: Clear to auscultation bilaterally, no wheezing, rhonchi or rales  Abd: Soft, nontender, no hepatomegaly  Ext: No edema, pulses 2+ Musculoskeletal: No deformities, BUE and BLE strength normal and equal Skin: Warm and dry, no rashes   Neuro: Alert and oriented to  person, place, time, and situation, CNII-XII grossly intact, no focal deficits  Psych: Normal mood and affect   ASSESSMENT:   Nicholas Macdonald is a 65 y.o. male who presents for the following: 1. Palpitations   2. PAC (premature atrial contraction)   3. Coronary artery disease involving native coronary artery of native heart without angina pectoris   4. Mixed hyperlipidemia   5. Asymptomatic LV dysfunction   6. Aortic root aneurysm (HCC)     PLAN:   1. Palpitations 2. PAC (premature atrial contraction) -Found to have PACs.  Symptoms have improved with metoprolol.  We will continue this.  3. Coronary artery disease involving native coronary artery of native heart without angina pectoris -Single-vessel obstructive CAD in the mid LAD.  Positive by CT FFR.  No symptoms from this.  We will continue with aggressive medical control.  EF is normal. -He will continue aspirin 81 mg daily. -LDL cholesterol is at goal.  Continue Lipitor 40. -He is on metoprolol succinate 100 mg daily.  Seems to be doing well.  The only indication for heart cath would be symptoms.  4. Mixed hyperlipidemia -Continue statin.  At goal LDL cholesterol.  5. Asymptomatic LV dysfunction -EF roughly 50%.  No symptoms from this.  He will continue his beta-blocker as well as losartan.  No symptoms concerning for heart failure.  6. Aortic root aneurysm (HCC) -Most recent chest CTA shows an aortic aneurysm of the root up to 46 mm.  We did repeat an  echocardiogram 6 months after which shows stability.  I would recommend a follow-up CTA that is gated in 1 year just to assess stability. -He has a tricuspid aortic valve.  No family history of aortic disease.   Disposition: Return in about 1 year (around 07/07/2021).  Medication Adjustments/Labs and Tests Ordered: Current medicines are reviewed at length with the patient today.  Concerns regarding medicines are outlined above.  Orders Placed This Encounter  Procedures   CT ANGIO CHEST AORTA W/ & OR WO/CM & GATING ([facility] ONLY)   No orders of the defined types were placed in this encounter.   Patient Instructions  Medication Instructions:  The current medical regimen is effective;  continue present plan and medications.  *If you need a refill on your cardiac medications before your next appointment, please call your pharmacy*   Testing/Procedures: GATED CT AORTA CHEST (in 12 months before follow up)   Follow-Up: At [facility], you and your health needs are our priority.  As part of our continuing mission to provide you with exceptional heart care, we have created designated Provider Care Teams.  These Care Teams include your primary Cardiologist (physician) and Advanced Practice Providers (APPs -  Physician Assistants and Nurse Practitioners) who all work together to provide you with the care you need, when you need it.  We recommend signing up for the patient portal called "MyChart".  Sign up information is provided on this After Visit Summary.  MyChart is used to connect with patients for Virtual Visits (Telemedicine).  Patients are able to view lab/test results, encounter notes, upcoming appointments, etc.  Non-urgent messages can be sent to your provider as well.   To learn more about what you can do with MyChart, go to [email or web address].    Your next appointment:   12 month(s)  The format for your next appointment:   In Person  Provider:   Lennie Odor,  MD     Time Spent with Patient: I have spent  a total of 25 minutes with patient reviewing hospital notes, telemetry, EKGs, labs and examining the patient as well as establishing an assessment and plan that was discussed with the patient.  > 50% of time was spent in direct patient care.  Signed, Lenna Gilford. Flora Lipps, MD, [facility]  [facility]  [address], [address] [place], [place] [address] [phone]  07/07/2020 2:44 PM

## 2020-07-07 ENCOUNTER — Other Ambulatory Visit: Payer: Self-pay

## 2020-07-07 ENCOUNTER — Encounter: Payer: Self-pay | Admitting: Cardiovascular Disease

## 2020-07-07 ENCOUNTER — Ambulatory Visit: Payer: Managed Care, Other (non HMO) | Admitting: Cardiovascular Disease

## 2020-07-07 VITALS — BP 118/86 | HR 86 | Ht 75.0 in | Wt 330.0 lb

## 2020-07-07 DIAGNOSIS — I491 Atrial premature depolarization: Secondary | ICD-10-CM | POA: Diagnosis not present

## 2020-07-07 DIAGNOSIS — I719 Aortic aneurysm of unspecified site, without rupture: Secondary | ICD-10-CM

## 2020-07-07 DIAGNOSIS — R002 Palpitations: Secondary | ICD-10-CM

## 2020-07-07 DIAGNOSIS — Q2543 Congenital aneurysm of aorta: Secondary | ICD-10-CM

## 2020-07-07 DIAGNOSIS — I2510 Atherosclerotic heart disease of native coronary artery without angina pectoris: Secondary | ICD-10-CM

## 2020-07-07 DIAGNOSIS — E782 Mixed hyperlipidemia: Secondary | ICD-10-CM

## 2020-07-07 DIAGNOSIS — I519 Heart disease, unspecified: Secondary | ICD-10-CM

## 2020-07-07 DIAGNOSIS — I7121 Aneurysm of the ascending aorta, without rupture: Secondary | ICD-10-CM

## 2020-07-07 NOTE — Patient Instructions (Signed)
Medication Instructions:  The current medical regimen is effective;  continue present plan and medications.  *If you need a refill on your cardiac medications before your next appointment, please call your pharmacy*   Testing/Procedures: GATED CT AORTA CHEST (in 12 months before follow up)   Follow-Up: At [facility], you and your health needs are our priority.  As part of our continuing mission to provide you with exceptional heart care, we have created designated Provider Care Teams.  These Care Teams include your primary Cardiologist (physician) and Advanced Practice Providers (APPs -  Physician Assistants and Nurse Practitioners) who all work together to provide you with the care you need, when you need it.  We recommend signing up for the patient portal called "MyChart".  Sign up information is provided on this After Visit Summary.  MyChart is used to connect with patients for Virtual Visits (Telemedicine).  Patients are able to view lab/test results, encounter notes, upcoming appointments, etc.  Non-urgent messages can be sent to your provider as well.   To learn more about what you can do with MyChart, go to [email or web address].    Your next appointment:   12 month(s)  The format for your next appointment:   In Person  Provider:   Lennie Odor, MD

## 2020-07-13 ENCOUNTER — Other Ambulatory Visit: Payer: Self-pay | Admitting: Cardiovascular Disease

## 2020-11-06 ENCOUNTER — Other Ambulatory Visit: Payer: Self-pay | Admitting: Cardiovascular Disease

## 2021-02-24 ENCOUNTER — Encounter: Payer: Self-pay | Admitting: Cardiovascular Disease

## 2021-03-10 ENCOUNTER — Other Ambulatory Visit: Payer: Self-pay | Admitting: Cardiovascular Disease

## 2021-05-08 ENCOUNTER — Other Ambulatory Visit: Payer: Self-pay | Admitting: Cardiovascular Disease

## 2021-05-29 ENCOUNTER — Encounter (HOSPITAL_COMMUNITY): Payer: Self-pay

## 2021-05-29 ENCOUNTER — Ambulatory Visit (HOSPITAL_COMMUNITY): Payer: BC Managed Care – PPO

## 2021-06-03 IMAGING — CR DG CHEST 2V
2 series · 2 of 2 positions shown · non-contrast
Comparison: None

CLINICAL DATA: Tachycardia, feels like heart is racing,
lightheaded, short of breath, hypertension, non insulin-dependent
diabetic

EXAM:
CHEST - 2 VIEW

[w chest pa]
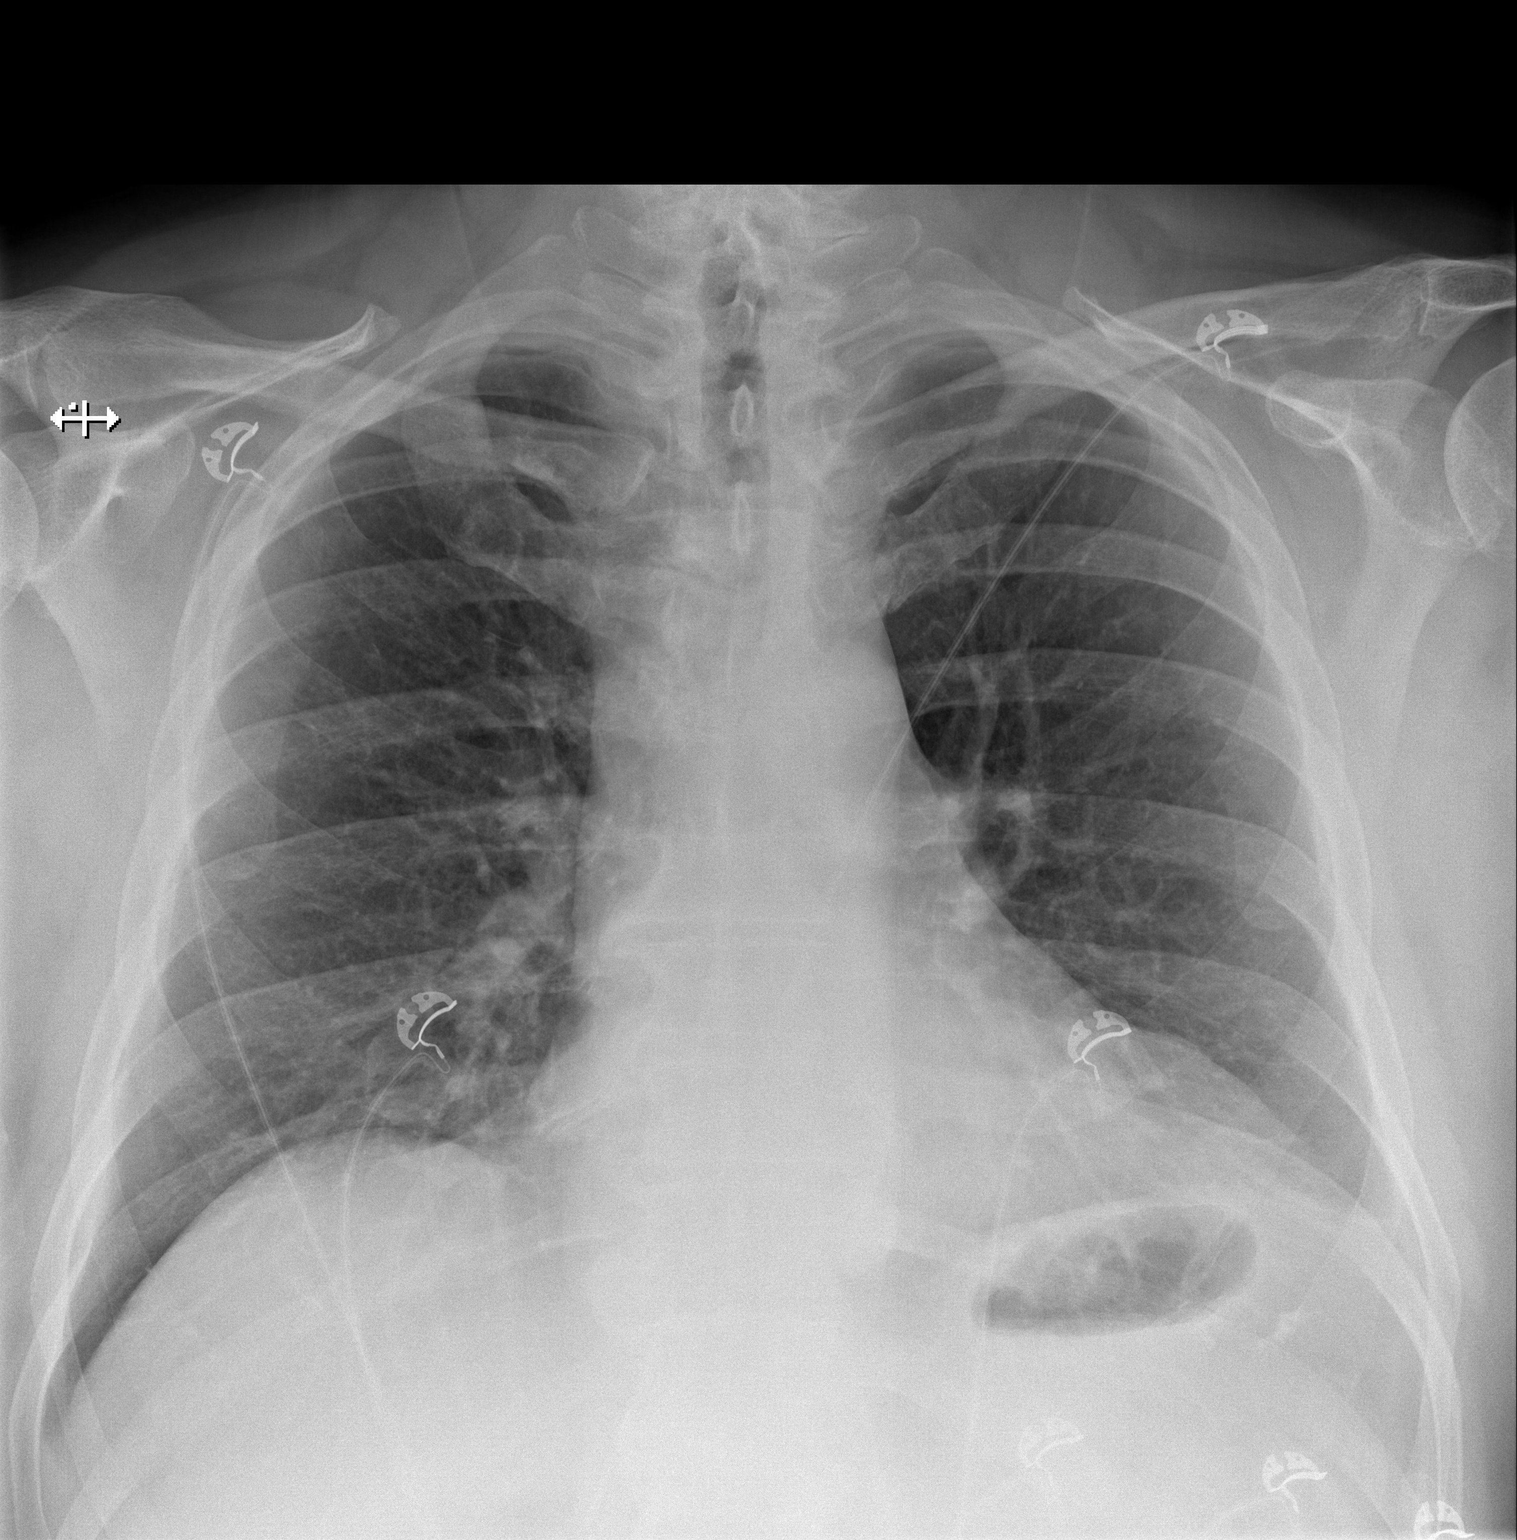

[w chest lat]
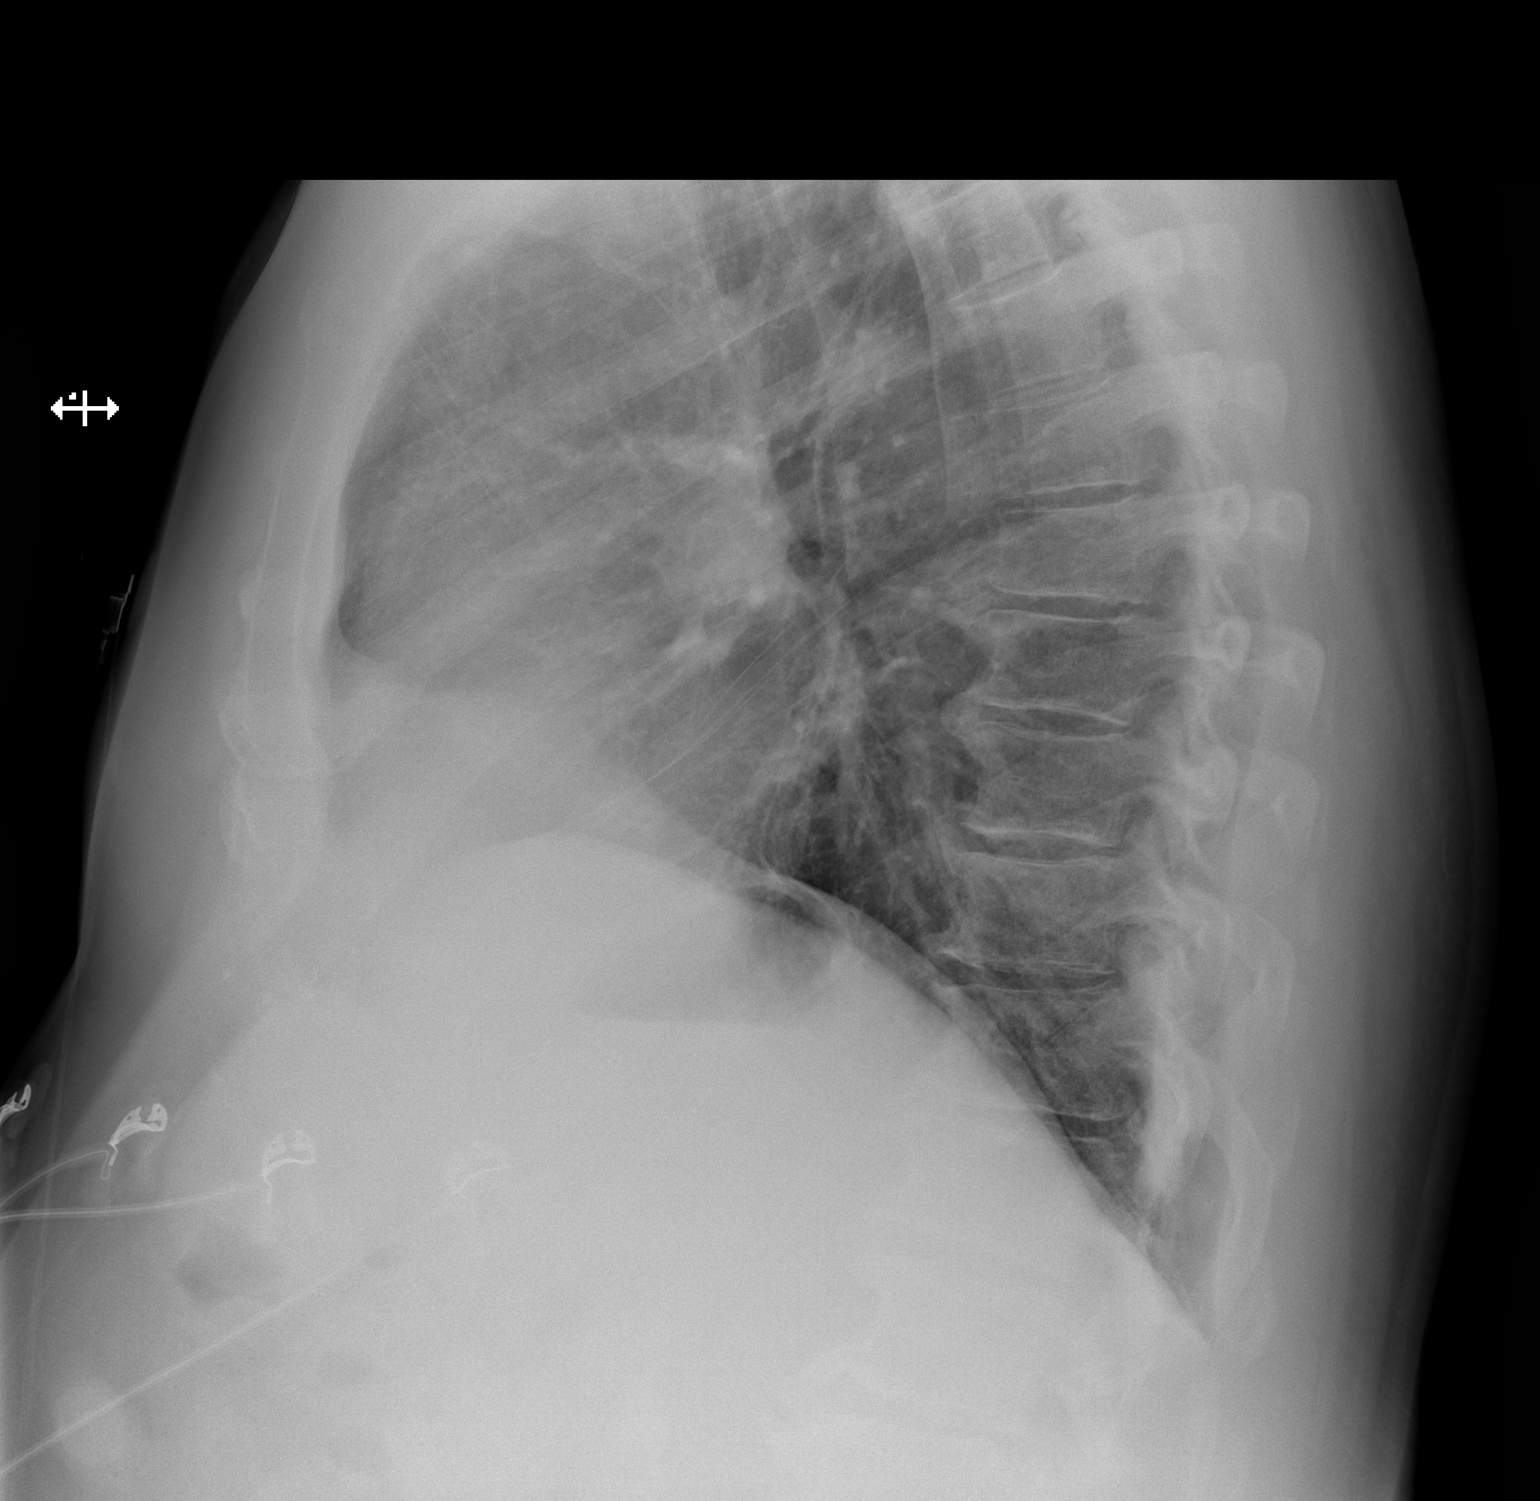

[2 of 2 positions shown; findings below may reference images not displayed]

FINDINGS: Upper normal heart size.

Mediastinal contours and pulmonary vascularity normal.

Lungs clear.

No pulmonary infiltrate, pleural effusion, or pneumothorax.

Scattered endplate spur formation thoracic spine.
IMPRESSION: No acute abnormalities.

## 2021-06-14 DIAGNOSIS — Z1211 Encounter for screening for malignant neoplasm of colon: Secondary | ICD-10-CM | POA: Diagnosis not present

## 2021-06-14 DIAGNOSIS — Z1212 Encounter for screening for malignant neoplasm of rectum: Secondary | ICD-10-CM | POA: Diagnosis not present

## 2021-06-15 ENCOUNTER — Other Ambulatory Visit: Payer: Self-pay | Admitting: Cardiovascular Disease

## 2021-06-15 DIAGNOSIS — E119 Type 2 diabetes mellitus without complications: Secondary | ICD-10-CM | POA: Diagnosis not present

## 2021-07-12 DIAGNOSIS — L304 Erythema intertrigo: Secondary | ICD-10-CM | POA: Diagnosis not present

## 2021-07-12 DIAGNOSIS — L853 Xerosis cutis: Secondary | ICD-10-CM | POA: Diagnosis not present

## 2021-07-12 DIAGNOSIS — L814 Other melanin hyperpigmentation: Secondary | ICD-10-CM | POA: Diagnosis not present

## 2021-07-22 IMAGING — CT CT HEART MORP W/ CTA COR W/ SCORE W/ CA W/CM &/OR W/O CM
1 of 9 series · 4 of 20 positions shown, 5 images · non-contrast
Comparison: 05/06/2019 chest radiograph.
COMPARISON: 05/06/2019 chest radiograph.

Addendum:
EXAM:
OVER-READ INTERPRETATION  CT CHEST

The following report is an over-read performed by radiologist Dr.
Ebadat Tiger [REDACTED] on 06/24/2019. This over-read
does not include interpretation of cardiac or coronary anatomy or
pathology. The coronary CTA interpretation by the cardiologist is
attached.
CLINICAL DATA: Cardiomyopathy
Cardiac/Coronary CTA
TECHNIQUE: The patient was scanned on a Phillips Force scanner. A 100 kV
prospective scan was triggered in the descending thoracic aorta at
111 HU's. Axial non-contrast 3 mm slices were carried out through
the heart. The data set was analyzed on a dedicated work station and
scored using the Agatson method. Gantry rotation speed was 250 msecs
and collimation was .6 mm. 5 mg IV metoprolol and 0.8 mg of sl NTG
was given. The 3D data set was reconstructed in 5% intervals of the
35-75 % of the R-R cycle. Diastolic phases were analyzed on a
dedicated work station using MPR, MIP and VRT modes. The patient
received 80 cc of contrast.

[Series 9: multiphase · axial · 0.39mm/px · z∈[-200,-128]mm · 4 of 804 slices shown, 5 images]
[im 161/804  vessel]
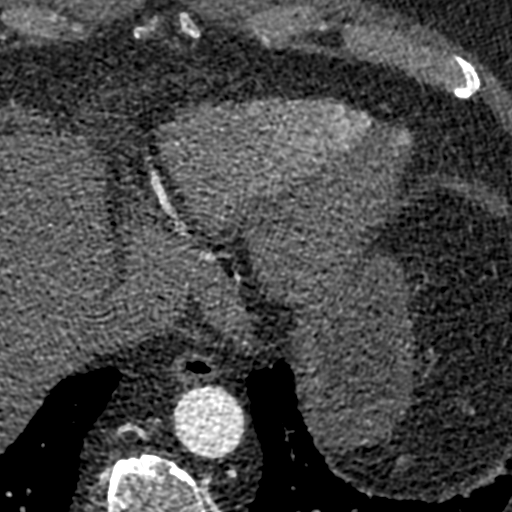
[im 161/804  lung]
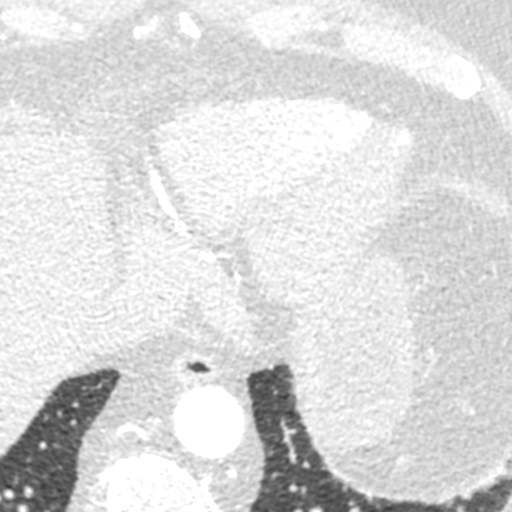
[im 322/804  vessel]
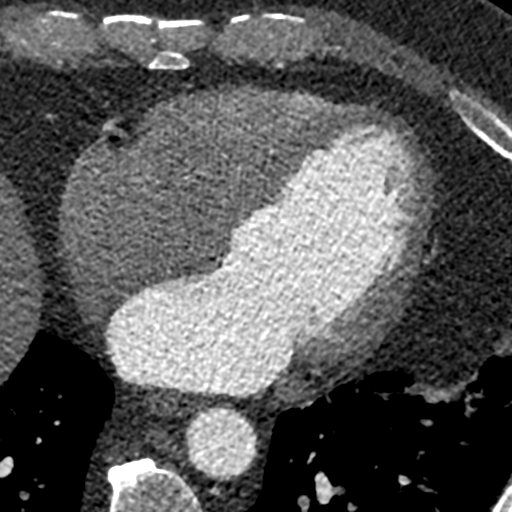
[im 482/804  vessel]
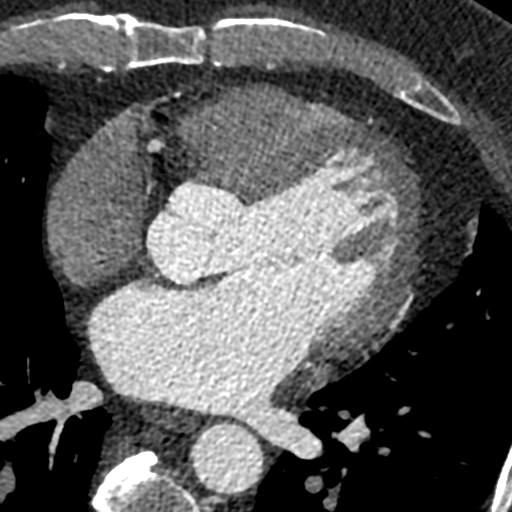
[im 643/804  vessel]
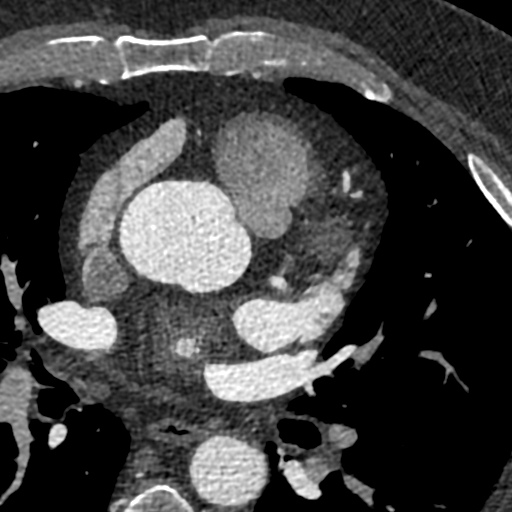

[4 of 20 positions shown; findings below may reference images not displayed]

FINDINGS: Vascular: Upper normal ascending aortic size, including at 3.8 cm.
No central pulmonary embolism, on this non-dedicated study.
Pulmonary artery enlargement, outflow tract 3.3 cm.

Mediastinum/Nodes: No imaged thoracic adenopathy.

Lungs/Pleura: No pleural fluid.  Clear imaged lungs.

Upper Abdomen: Normal imaged portions of the liver, spleen, stomach.

Musculoskeletal: No acute osseous abnormality.
IMPRESSION: 1. No acute process in the imaged chest.
2. Pulmonary artery enlargement suggests pulmonary arterial
hypertension.
FINDINGS: Image quality: Average. There was a PVC during prospective
acquisition. Systole phases were only able to be interpreted.

Noise artifact is: Limited.

Coronary Arteries:  Normal coronary origin.  Right dominance.

Left main: The left main is a large caliber vessel with a normal
take off from the left coronary cusp that bifurcates to form a left
anterior descending artery and a left circumflex artery. There is no
plaque or stenosis.

Left anterior descending artery: The proximal LAD contains minimal
calcified plaque (<25%). The mid LAD contains severe non-calcified
plaque (70-99%). The distal LAD appears patent. The LAD gives off 2
patent diagonal branches.

Left circumflex artery: The LCX is non-dominant and patent with no
evidence of plaque or stenosis. The LCX gives off 1 patent obtuse
marginal branch.

Right coronary artery: The RCA is dominant with normal take off from
the right coronary cusp. There is significant stair step artifact
however the images are interpretable. The RCA does not appear to
have any significant stenoses. The RCA terminates as a PDA and right
posterolateral branch without evidence of plaque or stenosis.

Right Atrium: Right atrial size is within normal limits.

Right Ventricle: The right ventricular cavity is within normal
limits.

Left Atrium: Left atrial size is normal in size with no left atrial
appendage filling defect. A PFO is present.

Left Ventricle: The ventricular cavity size is within normal limits.
There are no stigmata of prior infarction. There is no abnormal
filling defect.

Pulmonary arteries: Normal in size without proximal filling defect.

Pulmonary veins: Normal pulmonary venous drainage.

Pericardium: Normal thickness with no significant effusion or
calcium present.

Cardiac valves: The aortic valve is trileaflet without significant
calcification. The mitral valve is normal structure without
significant calcification.

Aorta: Aortic root aneurysm noted up to 46 mm using the double
oblique method.

Extra-cardiac findings: See attached radiology report for
non-cardiac structures.
IMPRESSION: 1. Coronary calcium score of 34. This was 66th percentile for age
and sex matched controls.

2. Normal coronary origin with right dominance.

3. Severe mid LAD non-calcified plaque (70-99%).

4. Small PFO.

5. Aortic root aneurysm up to 46 mm in maximal diameter. Tricuspid
aortic valve noted.

RECOMMENDATIONS:
1. Severe (70-99%) mid LAD stenosis. CT FFR will be submitted.
Consider symptom-guided anti-ischemic pharmacotherapy as well as
risk factor modification per guideline directed care. Invasive
coronary angiography is recommended with revascularization per
published guideline statements.

*** End of Addendum ***
EXAM:
OVER-READ INTERPRETATION  CT CHEST

The following report is an over-read performed by radiologist Dr.
Ebadat Tiger [REDACTED] on 06/24/2019. This over-read
does not include interpretation of cardiac or coronary anatomy or
pathology. The coronary CTA interpretation by the cardiologist is
attached.
FINDINGS: Vascular: Upper normal ascending aortic size, including at 3.8 cm.
No central pulmonary embolism, on this non-dedicated study.
Pulmonary artery enlargement, outflow tract 3.3 cm.

Mediastinum/Nodes: No imaged thoracic adenopathy.

Lungs/Pleura: No pleural fluid.  Clear imaged lungs.

Upper Abdomen: Normal imaged portions of the liver, spleen, stomach.

Musculoskeletal: No acute osseous abnormality.
IMPRESSION: 1. No acute process in the imaged chest.
2. Pulmonary artery enlargement suggests pulmonary arterial
hypertension.

## 2021-07-27 ENCOUNTER — Other Ambulatory Visit: Payer: Self-pay | Admitting: Cardiovascular Disease

## 2021-08-27 DIAGNOSIS — L281 Prurigo nodularis: Secondary | ICD-10-CM | POA: Diagnosis not present

## 2021-08-27 DIAGNOSIS — L304 Erythema intertrigo: Secondary | ICD-10-CM | POA: Diagnosis not present

## 2021-08-27 DIAGNOSIS — L02821 Furuncle of head [any part, except face]: Secondary | ICD-10-CM | POA: Diagnosis not present

## 2021-08-27 DIAGNOSIS — L570 Actinic keratosis: Secondary | ICD-10-CM | POA: Diagnosis not present

## 2021-08-28 ENCOUNTER — Other Ambulatory Visit: Payer: Self-pay | Admitting: Cardiovascular Disease

## 2021-09-22 ENCOUNTER — Ambulatory Visit
Admission: RE | Admit: 2021-09-22 | Discharge: 2021-09-22 | Disposition: A | Discharge status: Home / Self Care | Payer: BC Managed Care – PPO | Source: Ambulatory Visit | Attending: Emergency Medicine | Admitting: Emergency Medicine

## 2021-09-22 VITALS — BP 114/75 | HR 97 | Temp 99.4°F | Resp 18

## 2021-09-22 DIAGNOSIS — J029 Acute pharyngitis, unspecified: Secondary | ICD-10-CM | POA: Diagnosis not present

## 2021-09-22 DIAGNOSIS — B349 Viral infection, unspecified: Secondary | ICD-10-CM | POA: Diagnosis not present

## 2021-09-22 LAB — POCT RAPID STREP A (OFFICE): Rapid Strep A Screen: NEGATIVE

## 2021-09-22 NOTE — Discharge Instructions (Signed)
Dear Nicholas Macdonald,  Thank you for visiting urgent care today.  I have enclosed some general information about viral illnesses that cover respiratory illnesses along with a myriad of other types of illnesses.  As we discussed, rest, stay well-hydrated, eating when hungry, activity as tolerated, ibuprofen and Tylenol for pain and fever are recommended.  If you do not feel better in the next 5 to 7 days, please feel free to return for repeat evaluation or if your symptoms become worse, please return sooner.

## 2021-09-22 NOTE — ED Provider Notes (Signed)
[facility]    CSN: [id number] Arrival date & time: 09/22/21  1236    HISTORY   Chief Complaint  Patient presents with   Sore Throat   HPI Nicholas Macdonald is a pleasant, 66 y.o. male who presents to urgent care today. Patient complains of a 3-day history of sore throat.  Patient states that he took at home COVID-19 test yesterday and today and both were negative.  Patient has a slightly elevated temperature on arrival, otherwise vital signs are normal.  Patient denies known sick contacts.  Patient states he has not take any medications to remedy his symptoms.  The history is provided by the patient.   Past Medical History:  Diagnosis Date   Diabetes mellitus without complication ([place])    Diverticulosis    Essential hypertension    Gout    HYPERGLYCEMIA 11/11/2006   Hyperlipidemia    MIGRAINE, COMMON 02/27/2009   NEPHROLITHIASIS, HX OF 11/11/2006   Overweight    PERIPHERAL NEUROPATHY 11/10/2009   Premature atrial contractions    Patient Active Problem List   Diagnosis Date Noted   Chest pain of uncertain etiology 11/91/4782   Palpitations 05/07/2019   Hypertension 02/05/2013   PERIPHERAL NEUROPATHY 11/10/2009   MIGRAINE, COMMON 02/27/2009   GOUT 11/11/2006   HYPERGLYCEMIA 11/11/2006   NEPHROLITHIASIS, HX OF 11/11/2006   Past Surgical History:  Procedure Laterality Date   TONSILLECTOMY     TRACHEOSTOMY     croup    Home Medications    Prior to Admission medications   Medication Sig Start Date End Date Taking? Authorizing Provider  allopurinol (ZYLOPRIM) 300 MG tablet TAKE ONE TABLET BY MOUTH ONCE DAILY 12/28/13   Shawna Orleans, Doe-Hyun R, DO  aspirin EC 81 MG tablet Take 1 tablet (81 mg total) by mouth daily. 06/30/19   O'NealCassie Freer, MD  atorvastatin (LIPITOR) 40 MG tablet TAKE 1 TABLET (40 MG TOTAL) BY MOUTH DAILY. 08/28/21   O'NealCassie Freer, MD  JARDIANCE 10 MG TABS tablet Take 10 mg by mouth daily. 04/09/19   [provider]  losartan  (COZAAR) 25 MG tablet Take 1 tablet (25 mg total) by mouth daily. 06/30/19 12/31/19  Geralynn Rile, MD  metFORMIN (GLUCOPHAGE) 500 MG tablet Take 500 mg by mouth daily. 04/12/19   [provider]  metoprolol succinate (TOPROL-XL) 100 MG 24 hr tablet TAKE 1 TABLET BY MOUTH IN THE MORNING AND 1 TABLET AT BEDTIME. TAKE WITH OR IMMEDIATELY FOLLOWING A MEAL. 08/28/21   O'Neal, Cassie Freer, MD  nitroGLYCERIN (NITROSTAT) 0.4 MG SL tablet Place 1 tablet (0.4 mg total) under the tongue every 5 (five) minutes as needed for chest pain. 06/30/19 12/31/19  O'NealCassie Freer, MD  OZEMPIC, 1 MG/DOSE, 2 MG/1.5ML SOPN Inject 1 mg into the skin once a week. Thursday 04/12/19   [provider]    Family History Family History  Problem Relation Age of Onset   Atrial fibrillation Father        pacemaker   Heart disease Father        afib   Cardiomyopathy Father    Diabetes Paternal Grandmother    Sudden death Neg Hx    Social History Social History   Tobacco Use   Smoking status: Never   Smokeless tobacco: Never  Substance Use Topics   Alcohol use: Not Currently   Drug use: Not Currently   Allergies   Patient has no known allergies.  Review of Systems Review of Systems Pertinent  findings revealed after performing a 14 point review of systems has been noted in the history of present illness.  Physical Exam Triage Vital Signs ED Triage Vitals  Enc Vitals Group     BP 11/24/20 0827 (!) 147/82     Pulse Rate 11/24/20 0827 72     Resp 11/24/20 0827 18     Temp 11/24/20 0827 98.3 F (36.8 C)     Temp Source 11/24/20 0827 Oral     SpO2 11/24/20 0827 98 %     Weight --      Height --      Head Circumference --      Peak Flow --      Pain Score 11/24/20 0826 5     Pain Loc --      Pain Edu? --      Excl. in [place]? --   No data found.  Updated Vital Signs BP 114/75 (BP Location: Left Arm)   Pulse 97   Temp 99.4 F (37.4 C) (Oral)   Resp 18   SpO2 95%   Physical  Exam Vitals and nursing note reviewed.  Constitutional:      General: He is not in acute distress.    Appearance: Normal appearance. He is not ill-appearing.  HENT:     Head: Normocephalic and atraumatic.     Salivary Glands: Right salivary gland is not diffusely enlarged or tender. Left salivary gland is not diffusely enlarged or tender.     Right Ear: Tympanic membrane, ear canal and external ear normal. No drainage. No middle ear effusion. There is no impacted cerumen. Tympanic membrane is not erythematous or bulging.     Left Ear: Tympanic membrane, ear canal and external ear normal. No drainage.  No middle ear effusion. There is no impacted cerumen. Tympanic membrane is not erythematous or bulging.     Nose: Nose normal. No nasal deformity, septal deviation, mucosal edema, congestion or rhinorrhea.     Right Turbinates: Not enlarged, swollen or pale.     Left Turbinates: Not enlarged, swollen or pale.     Right Sinus: No maxillary sinus tenderness or frontal sinus tenderness.     Left Sinus: No maxillary sinus tenderness or frontal sinus tenderness.     Mouth/Throat:     Lips: Pink. No lesions.     Mouth: Mucous membranes are moist. No oral lesions.     Tongue: No lesions. Tongue does not deviate from midline.     Palate: No mass and lesions.     Pharynx: Uvula midline. Pharyngeal swelling and posterior oropharyngeal erythema present. No oropharyngeal exudate or uvula swelling.     Tonsils: No tonsillar exudate. 1+ on the right. 1+ on the left.  Eyes:     General: Lids are normal.        Right eye: No discharge.        Left eye: No discharge.     Extraocular Movements: Extraocular movements intact.     Conjunctiva/sclera: Conjunctivae normal.     Right eye: Right conjunctiva is not injected.     Left eye: Left conjunctiva is not injected.  Neck:     Trachea: Trachea and phonation normal.  Cardiovascular:     Rate and Rhythm: Normal rate and regular rhythm.     Pulses: Normal  pulses.     Heart sounds: Normal heart sounds. No murmur heard.    No friction rub. No gallop.  Pulmonary:     Effort: Pulmonary effort  is normal. No accessory muscle usage, prolonged expiration or respiratory distress.     Breath sounds: Normal breath sounds. No stridor, decreased air movement or transmitted upper airway sounds. No decreased breath sounds, wheezing, rhonchi or rales.  Chest:     Chest wall: No tenderness.  Musculoskeletal:        General: Normal range of motion.     Cervical back: Normal range of motion and neck supple. Normal range of motion.  Lymphadenopathy:     Cervical: Cervical adenopathy present.  Skin:    General: Skin is warm and dry.     Findings: No erythema or rash.  Neurological:     General: No focal deficit present.     Mental Status: He is alert and oriented to person, place, and time.  Psychiatric:        Mood and Affect: Mood normal.        Behavior: Behavior normal.     Visual Acuity Right Eye Distance:   Left Eye Distance:   Bilateral Distance:    Right Eye Near:   Left Eye Near:    Bilateral Near:     UC Couse / Diagnostics / Procedures:     Radiology No results found.  Procedures Procedures (including critical care time) EKG  Pending results:  Labs Reviewed  CULTURE, GROUP A STREP [facility])  POCT RAPID STREP A (OFFICE)    Medications Ordered in UC: Medications - No data to display  UC Diagnoses / Final Clinical Impressions(s)   I have reviewed the triage vital signs and the nursing notes.  Pertinent labs & imaging results that were available during my care of the patient were reviewed by me and considered in my medical decision making (see chart for details).    Final diagnoses:  Sore throat  Viral illness  Rapid strep test today was negative, throat culture pending, based on physical exam findings I believe that bacterial pharyngitis is unlikely. Patient politely declined my offer for viral testing today.   Conservative care recommended.  Return precautions advised.  ED Prescriptions   None    PDMP not reviewed this encounter.  Disposition Upon Discharge:  Condition: stable for discharge home Home: take medications as prescribed; routine discharge instructions as discussed; follow up as advised.  Patient presented with an acute illness with associated systemic symptoms and significant discomfort requiring urgent management. In my opinion, this is a condition that a prudent lay person (someone who possesses an average knowledge of health and medicine) may potentially expect to result in complications if not addressed urgently such as respiratory distress, impairment of bodily function or dysfunction of bodily organs.   Routine symptom specific, illness specific and/or disease specific instructions were discussed with the patient and/or caregiver at length.   As such, the patient has been evaluated and assessed, work-up was performed and treatment was provided in alignment with urgent care protocols and evidence based medicine.  Patient/parent/caregiver has been advised that the patient may require follow up for further testing and treatment if the symptoms continue in spite of treatment, as clinically indicated and appropriate.  If the patient was tested for COVID-19, Influenza and/or RSV, then the patient/parent/guardian was advised to isolate at home pending the results of his/her diagnostic coronavirus test and potentially longer if they're positive. I have also advised pt that if his/her COVID-19 test returns positive, it's recommended to self-isolate for at least 10 days after symptoms first appeared AND until fever-free for 24 hours without fever reducer AND  other symptoms have improved or resolved. Discussed self-isolation recommendations as well as instructions for household member/close contacts as per the [facility] and [place] DHHS, and also gave patient the [place] packet with this  information.  Patient/parent/caregiver has been advised to return to the [facility] or PCP in 3-5 days if no better; to PCP or the Emergency Department if new signs and symptoms develop, or if the current signs or symptoms continue to change or worsen for further workup, evaluation and treatment as clinically indicated and appropriate  The patient will follow up with their current PCP if and as advised. If the patient does not currently have a PCP we will assist them in obtaining one.   The patient may need specialty follow up if the symptoms continue, in spite of conservative treatment and management, for further workup, evaluation, consultation and treatment as clinically indicated and appropriate.  Patient/parent/caregiver verbalized understanding and agreement of plan as discussed.  All questions were addressed during visit.  Please see discharge instructions below for further details of plan.  Discharge Instructions:   Discharge Instructions      Dear Mr. Gaddie,  Thank you for visiting urgent care today.  I have enclosed some general information about viral illnesses that cover respiratory illnesses along with a myriad of other types of illnesses.  As we discussed, rest, stay well-hydrated, eating when hungry, activity as tolerated, ibuprofen and Tylenol for pain and fever are recommended.  If you do not feel better in the next 5 to 7 days, please feel free to return for repeat evaluation or if your symptoms become worse, please return sooner.        This office note has been dictated using Museum/gallery curator.  Unfortunately, this method of dictation can sometimes lead to typographical or grammatical errors.  I apologize for your inconvenience in advance if this occurs.  Please do not hesitate to reach out to me if clarification is needed.      Lynden Oxford Scales, PA-C 09/22/21 1445

## 2021-09-22 NOTE — ED Triage Notes (Signed)
The pt c/o sore throat since Thursday.  At home Covid test yesterday and today were negative.  Home interventions: none

## 2021-09-25 LAB — CULTURE, GROUP A STREP (THRC)

## 2021-10-15 DIAGNOSIS — Z125 Encounter for screening for malignant neoplasm of prostate: Secondary | ICD-10-CM | POA: Diagnosis not present

## 2021-10-15 DIAGNOSIS — M109 Gout, unspecified: Secondary | ICD-10-CM | POA: Diagnosis not present

## 2021-10-15 DIAGNOSIS — E785 Hyperlipidemia, unspecified: Secondary | ICD-10-CM | POA: Diagnosis not present

## 2021-10-15 DIAGNOSIS — E119 Type 2 diabetes mellitus without complications: Secondary | ICD-10-CM | POA: Diagnosis not present

## 2021-10-22 DIAGNOSIS — Z0000 Encounter for general adult medical examination without abnormal findings: Secondary | ICD-10-CM | POA: Diagnosis not present

## 2021-10-22 DIAGNOSIS — R82998 Other abnormal findings in urine: Secondary | ICD-10-CM | POA: Diagnosis not present

## 2021-10-22 DIAGNOSIS — E119 Type 2 diabetes mellitus without complications: Secondary | ICD-10-CM | POA: Diagnosis not present

## 2021-11-18 NOTE — Progress Notes (Unsigned)
Cardiology Office Note:   Date:  11/19/2021  NAME:  Nicholas Macdonald    MRN: [id number] DOB:  06/11/1955   PCP:  Creola Corn, MD  Cardiologist:  Reatha Harps, MD  Electrophysiologist:  None   Referring MD: Creola Corn, MD   Chief Complaint  Patient presents with   Follow-up    History of Present Illness:   Nicholas Macdonald is a 66 y.o. male with a hx of CAD, HFmEF, aortic root aneurysm who presents for follow-up.  He he reports he is doing well.  Denies any chest pain or trouble breathing.  He is exercising as much as possible.  No limitations.  We are treating his CAD medically.  No symptoms of heart failure.  He needs repeat imaging of his aortic root.  He reports his diabetes is well controlled.  Most recent A1c 7.4.  He tells me he had lipids drawn last week.  Tells me they are within limits.  He will forward me the results.  EKG shows normal sinus rhythm with no acute ischemic changes or evidence of infarction.   Problem List 1. PACs 2. 1 vessel CAD -70-99% mid LAD (CT FFR 0.68) 3. HLD -T chol 108, HDL 42, LDL 45, TG 117 4. 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 Subclinical LV dysfunction -EF 45-50% 6. DM -A1c 7.4  Past Medical History: Past Medical History:  Diagnosis Date   Diabetes mellitus without complication (HCC)    Diverticulosis    Essential hypertension    Gout    HYPERGLYCEMIA 11/11/2006   Hyperlipidemia    MIGRAINE, COMMON 02/27/2009   NEPHROLITHIASIS, HX OF 11/11/2006   Overweight    PERIPHERAL NEUROPATHY 11/10/2009   Premature atrial contractions     Past Surgical History: Past Surgical History:  Procedure Laterality Date   TONSILLECTOMY     TRACHEOSTOMY     croup    Current Medications: Current Meds  Medication Sig   allopurinol (ZYLOPRIM) 300 MG tablet TAKE ONE TABLET BY MOUTH ONCE DAILY   aspirin EC 81 MG tablet Take 1 tablet (81 mg total) by mouth daily.   atorvastatin (LIPITOR) 40 MG tablet TAKE 1 TABLET (40  MG TOTAL) BY MOUTH DAILY.   JARDIANCE 10 MG TABS tablet Take 10 mg by mouth daily.   metFORMIN (GLUCOPHAGE) 500 MG tablet Take 500 mg by mouth daily.   metoprolol succinate (TOPROL-XL) 100 MG 24 hr tablet TAKE 1 TABLET BY MOUTH IN THE MORNING AND 1 TABLET AT BEDTIME. TAKE WITH OR IMMEDIATELY FOLLOWING A MEAL.   OZEMPIC, 1 MG/DOSE, 2 MG/1.5ML SOPN Inject 1 mg into the skin once a week. Thursday     Allergies:    Patient has no known allergies.   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 There is no history of Sudden death.  ROS:   All other ROS reviewed and negative. Pertinent positives noted in the HPI.     EKGs/Labs/Other Studies Reviewed:   The following studies were personally reviewed by me today:  EKG:  EKG is ordered today.  The ekg ordered today demonstrates normal sinus rhythm heart rate 91, no acute ischemic changes or evidence of infarction, and was personally reviewed by me.   CCTA  06/24/2019 IMPRESSION: 1. Coronary calcium score of 34. This was 66th percentile for age and sex matched controls.   2. Normal coronary origin with right dominance.   3. Severe mid LAD non-calcified plaque (70-99%).   4. Small PFO.   5. Aortic root aneurysm  TTE 01/26/2020  1. Left ventricular ejection fraction, by estimation, is 45 to 50%. The  left ventricle has mildly decreased function. The left ventricle  demonstrates global hypokinesis. There is mild concentric left ventricular  hypertrophy. Left ventricular diastolic  parameters are consistent with Grade I diastolic dysfunction (impaired  relaxation).   2. Right ventricular systolic function is normal. The right ventricular  size is normal.   3. The mitral valve is grossly normal. No evidence of mitral valve  regurgitation.   4. The aortic valve is tricuspid. Aortic valve regurgitation is mild. No  aortic stenosis is present. Aortic regurgitation PHT measures 423 msec.   5. Aortic dilatation noted. There is mild dilatation of the aortic root,  measuring 43 mm. There is mild dilatation of the ascending aorta,  measuring 40 mm.   Recent Labs: No results found for requested labs within last 365 days.   Recent Lipid Panel    Component Value Date/Time   CHOL 108 07/30/2019 0934   TRIG 117 07/30/2019 0934   HDL 42 07/30/2019 0934   CHOLHDL 2.6 07/30/2019 0934   CHOLHDL 4 02/28/2012 1027   VLDL 35.2 02/28/2012 1027   LDLCALC 45 07/30/2019 0934   LDLDIRECT 121.5 07/03/2009 0924    Physical Exam:   VS:  BP 124/84 (BP Location: Left Arm, Patient Position: Sitting, Cuff Size: Large)   Pulse 91   Ht 6\' 3"  (1.905 m)   Wt (!) 324 lb 12.8 oz (147.3 kg)   SpO2 95%   BMI 40.60 kg/m    Wt Readings from Last 3 Encounters:  11/19/21 (!) 324 lb 12.8 oz (147.3 kg)  07/07/20 (!) 330 lb (149.7 kg)  12/31/19 (!) 333 lb (151 kg)    General: Well nourished, well developed, in no acute distress Head: Atraumatic, normal size   Eyes: PEERLA, EOMI  Neck: Supple, no JVD Endocrine: No thryomegaly Cardiac: Normal S1, S2; RRR; no murmurs, rubs, or gallops Lungs: Clear to auscultation bilaterally, no wheezing, rhonchi or rales  Abd: Soft, nontender, no hepatomegaly  Ext: No edema, pulses 2+ Musculoskeletal: No deformities, BUE and BLE strength normal and equal Skin: Warm and dry, no rashes   Neuro: Alert and oriented to person, place, time, and situation, CNII-XII grossly intact, no focal deficits  Psych: Normal mood and affect   ASSESSMENT:  ALARIC GLADWIN is a 66 y.o. male who presents for the following: 1. Coronary artery disease involving native coronary artery of native heart without angina pectoris   2. Aortic root aneurysm ([place])   3. Asymptomatic LV dysfunction   4. Premature atrial contraction     PLAN:   1. Coronary artery disease involving native coronary artery of native heart without angina pectoris -Mid LAD lesion that is obstructive by CT FFR.  No symptoms of angina.  This can be treated medically.  This is single-vessel CAD.  Continue aspirin as well as Lipitor.  He will forward me the results of his most recent lipid profile.  Denies any chest pain or trouble breathing.  On metoprolol succinate 100 mg daily.  2. Aortic root aneurysm (HCC) -Aortic root 46 mm on CT in 2021.  Echo was comparable.  Recheck echo.  3. Asymptomatic LV dysfunction -EF 45-50%.  No symptoms of heart failure.  On metoprolol succinate 100 mg daily.  On losartan 25 mg daily.  On Jardiance 10 mg daily.  4. Premature atrial contraction -Welcome total metoprolol.  No symptoms.  Disposition: Return in about 1 year (around 11/20/2022).  Medication Adjustments/Labs and Tests Ordered: Current medicines are reviewed at length with the patient today.  Concerns regarding medicines are outlined above.  Orders Placed This Encounter  Procedures   EKG 12-Lead   ECHOCARDIOGRAM COMPLETE   No orders of the defined types were  placed in this encounter.   Patient Instructions  Medication Instructions:  The current medical regimen is effective;  continue present plan and medications.  *If you need a refill on your cardiac medications before your next appointment, please call your pharmacy*   Testing/Procedures: Echocardiogram - Your physician has requested that you have an echocardiogram. Echocardiography is a painless test that uses sound waves to create images of your heart. It provides your doctor with information about the size and shape of your heart and how well your heart's chambers and valves are working. This procedure takes approximately one hour. There are no restrictions for this procedure.     Follow-Up: At [facility], you and your health needs are our priority.  As part of our continuing mission to provide you with exceptional heart care, we have created designated Provider Care Teams.  These Care Teams include your primary Cardiologist (physician) and Advanced Practice Providers (APPs -  Physician Assistants and Nurse Practitioners) who all work together to provide you with the care you need, when you need it.  We recommend signing up for the patient portal called "MyChart".  Sign up information is provided on this After Visit Summary.  MyChart is used to connect with patients for Virtual Visits (Telemedicine).  Patients are able to view lab/test results, encounter notes, upcoming appointments, etc.  Non-urgent messages can be sent to your provider as well.   To learn more about what you can do with MyChart, go to [email or web address].    Your next appointment:   12 month(s)  The format for your next appointment:   In Person  Provider:   Evalina Field, MD            Time Spent with Patient: I have spent a total of 25 minutes with patient reviewing hospital notes, telemetry, EKGs, labs and examining the patient as well as establishing an assessment and plan that was  discussed with the patient.  > 50% of time was spent in direct patient care.  Signed, Addison Naegeli. Audie Box, MD,  [facility]  [facility]  [address] [place], [place] [address] [phone]  11/19/2021 4:35 PM

## 2021-11-19 ENCOUNTER — Encounter: Payer: Self-pay | Admitting: Cardiovascular Disease

## 2021-11-19 ENCOUNTER — Ambulatory Visit: Payer: BC Managed Care – PPO | Attending: Cardiovascular Disease | Admitting: Cardiovascular Disease

## 2021-11-19 VITALS — BP 124/84 | HR 91 | Ht 75.0 in | Wt 324.8 lb

## 2021-11-19 DIAGNOSIS — I2510 Atherosclerotic heart disease of native coronary artery without angina pectoris: Secondary | ICD-10-CM | POA: Diagnosis not present

## 2021-11-19 DIAGNOSIS — I491 Atrial premature depolarization: Secondary | ICD-10-CM | POA: Diagnosis not present

## 2021-11-19 DIAGNOSIS — I519 Heart disease, unspecified: Secondary | ICD-10-CM

## 2021-11-19 DIAGNOSIS — I7121 Aneurysm of the ascending aorta, without rupture: Secondary | ICD-10-CM | POA: Diagnosis not present

## 2021-11-19 NOTE — Patient Instructions (Signed)
Medication Instructions:  The current medical regimen is effective;  continue present plan and medications.  *If you need a refill on your cardiac medications before your next appointment, please call your pharmacy*   Testing/Procedures: Echocardiogram - Your physician has requested that you have an echocardiogram. Echocardiography is a painless test that uses sound waves to create images of your heart. It provides your doctor with information about the size and shape of your heart and how well your heart's chambers and valves are working. This procedure takes approximately one hour. There are no restrictions for this procedure.     Follow-Up: At [facility], you and your health needs are our priority.  As part of our continuing mission to provide you with exceptional heart care, we have created designated Provider Care Teams.  These Care Teams include your primary Cardiologist (physician) and Advanced Practice Providers (APPs -  Physician Assistants and Nurse Practitioners) who all work together to provide you with the care you need, when you need it.  We recommend signing up for the patient portal called "MyChart".  Sign up information is provided on this After Visit Summary.  MyChart is used to connect with patients for Virtual Visits (Telemedicine).  Patients are able to view lab/test results, encounter notes, upcoming appointments, etc.  Non-urgent messages can be sent to your provider as well.   To learn more about what you can do with MyChart, go to [email or web address].    Your next appointment:   12 month(s)  The format for your next appointment:   In Person  Provider:   Evalina Field, MD

## 2021-12-07 ENCOUNTER — Ambulatory Visit (HOSPITAL_COMMUNITY): Payer: BC Managed Care – PPO | Attending: Cardiovascular Disease

## 2021-12-07 DIAGNOSIS — I7121 Aneurysm of the ascending aorta, without rupture: Secondary | ICD-10-CM | POA: Diagnosis not present

## 2021-12-07 LAB — ECHOCARDIOGRAM COMPLETE
Area-P 1/2: 4.8 cm2
Calc EF: 48.9 %
P 1/2 time: 290 msec
S' Lateral: 4.5 cm
Single Plane A2C EF: 52.5 %
Single Plane A4C EF: 48 %

## 2021-12-07 MED ORDER — PERFLUTREN LIPID MICROSPHERE
1.0000 mL | INTRAVENOUS | Status: AC | PRN
  Administered 2021-12-07: 1 mL via INTRAVENOUS

## 2022-01-03 ENCOUNTER — Other Ambulatory Visit: Payer: Self-pay | Admitting: Cardiovascular Disease

## 2022-03-11 DIAGNOSIS — L821 Other seborrheic keratosis: Secondary | ICD-10-CM | POA: Diagnosis not present

## 2022-03-11 DIAGNOSIS — L814 Other melanin hyperpigmentation: Secondary | ICD-10-CM | POA: Diagnosis not present

## 2022-03-11 DIAGNOSIS — L304 Erythema intertrigo: Secondary | ICD-10-CM | POA: Diagnosis not present

## 2022-03-11 DIAGNOSIS — L570 Actinic keratosis: Secondary | ICD-10-CM | POA: Diagnosis not present

## 2022-03-11 DIAGNOSIS — D225 Melanocytic nevi of trunk: Secondary | ICD-10-CM | POA: Diagnosis not present

## 2022-03-12 DIAGNOSIS — E119 Type 2 diabetes mellitus without complications: Secondary | ICD-10-CM | POA: Diagnosis not present

## 2022-03-12 DIAGNOSIS — I5022 Chronic systolic (congestive) heart failure: Secondary | ICD-10-CM | POA: Diagnosis not present

## 2022-03-12 DIAGNOSIS — I130 Hypertensive heart and chronic kidney disease with heart failure and stage 1 through stage 4 chronic kidney disease, or unspecified chronic kidney disease: Secondary | ICD-10-CM | POA: Diagnosis not present

## 2022-03-12 DIAGNOSIS — I2510 Atherosclerotic heart disease of native coronary artery without angina pectoris: Secondary | ICD-10-CM | POA: Diagnosis not present

## 2022-03-31 ENCOUNTER — Encounter: Payer: Self-pay | Admitting: Cardiovascular Disease

## 2022-04-01 MED ORDER — NITROGLYCERIN 0.4 MG SL SUBL: 0.4000 mg | SUBLINGUAL_TABLET | SUBLINGUAL | 1 refills | Status: AC | PRN

## 2022-06-03 DIAGNOSIS — I130 Hypertensive heart and chronic kidney disease with heart failure and stage 1 through stage 4 chronic kidney disease, or unspecified chronic kidney disease: Secondary | ICD-10-CM | POA: Diagnosis not present

## 2022-06-03 DIAGNOSIS — I5022 Chronic systolic (congestive) heart failure: Secondary | ICD-10-CM | POA: Diagnosis not present

## 2022-06-03 DIAGNOSIS — I2510 Atherosclerotic heart disease of native coronary artery without angina pectoris: Secondary | ICD-10-CM | POA: Diagnosis not present

## 2022-06-03 DIAGNOSIS — E119 Type 2 diabetes mellitus without complications: Secondary | ICD-10-CM | POA: Diagnosis not present

## 2022-08-02 ENCOUNTER — Other Ambulatory Visit: Payer: Self-pay | Admitting: Cardiovascular Disease

## 2022-08-05 ENCOUNTER — Other Ambulatory Visit: Payer: Self-pay | Admitting: Cardiovascular Disease

## 2022-08-16 DIAGNOSIS — E119 Type 2 diabetes mellitus without complications: Secondary | ICD-10-CM | POA: Diagnosis not present

## 2022-10-21 DIAGNOSIS — E119 Type 2 diabetes mellitus without complications: Secondary | ICD-10-CM | POA: Diagnosis not present

## 2022-10-21 DIAGNOSIS — Z125 Encounter for screening for malignant neoplasm of prostate: Secondary | ICD-10-CM | POA: Diagnosis not present

## 2022-10-21 DIAGNOSIS — E785 Hyperlipidemia, unspecified: Secondary | ICD-10-CM | POA: Diagnosis not present

## 2022-10-21 DIAGNOSIS — M109 Gout, unspecified: Secondary | ICD-10-CM | POA: Diagnosis not present

## 2022-10-28 DIAGNOSIS — Z0000 Encounter for general adult medical examination without abnormal findings: Secondary | ICD-10-CM | POA: Diagnosis not present

## 2022-10-28 DIAGNOSIS — Z23 Encounter for immunization: Secondary | ICD-10-CM | POA: Diagnosis not present

## 2022-10-28 DIAGNOSIS — R82998 Other abnormal findings in urine: Secondary | ICD-10-CM | POA: Diagnosis not present

## 2022-10-28 DIAGNOSIS — E119 Type 2 diabetes mellitus without complications: Secondary | ICD-10-CM | POA: Diagnosis not present

## 2022-10-29 DIAGNOSIS — D492 Neoplasm of unspecified behavior of bone, soft tissue, and skin: Secondary | ICD-10-CM | POA: Diagnosis not present

## 2022-10-29 DIAGNOSIS — D045 Carcinoma in situ of skin of trunk: Secondary | ICD-10-CM | POA: Diagnosis not present

## 2022-12-02 ENCOUNTER — Other Ambulatory Visit: Payer: Self-pay | Admitting: Cardiovascular Disease

## 2022-12-04 ENCOUNTER — Other Ambulatory Visit: Payer: Self-pay | Admitting: Cardiovascular Disease

## 2022-12-10 DIAGNOSIS — D045 Carcinoma in situ of skin of trunk: Secondary | ICD-10-CM | POA: Diagnosis not present

## 2022-12-10 DIAGNOSIS — L814 Other melanin hyperpigmentation: Secondary | ICD-10-CM | POA: Diagnosis not present

## 2022-12-10 DIAGNOSIS — D225 Melanocytic nevi of trunk: Secondary | ICD-10-CM | POA: Diagnosis not present

## 2022-12-10 DIAGNOSIS — Z09 Encounter for follow-up examination after completed treatment for conditions other than malignant neoplasm: Secondary | ICD-10-CM | POA: Diagnosis not present

## 2022-12-10 DIAGNOSIS — L821 Other seborrheic keratosis: Secondary | ICD-10-CM | POA: Diagnosis not present

## 2022-12-22 NOTE — Progress Notes (Signed)
Cardiology Office Note:  .   Date:  12/23/2022  ID:  Javahn, Muccio October 28, 1955, MRN [id number] PCP: Creola Corn, MD  [facility] Providers Cardiologist:  Reatha Harps, MD { History of Present Illness: Nicholas Macdonald is a 67 y.o. male with history of CAD, CHF, aortic root aneurysm, PACs who presents for follow-up.    History of Present Illness   Mr. Fortune, a 67 year old male with a history of CAD, obesity, hypertension, and hyperlipidemia, presents for a routine follow-up visit. The patient reports a significant improvement in his cardiovascular symptoms, with no recent episodes of chest pain or palpitations, which were previously a concern. However, he mentions experiencing back pain, which has led to a decrease in his regular exercise routine. He does not monitor his blood pressure at home, and an elevated reading was noted during this visit. The patient's cholesterol levels are well-managed, but there is room for improvement in his diabetes management. The patient has a known dilated aorta, which has remained stable but requires ongoing monitoring.          Problem List 1. PACs 2. 1 vessel CAD -70-99% mid LAD (CT FFR 0.68) 3. HLD -T chol 122, HDL 45, LDL 58, TG 97 4. 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 Subclinical LV dysfunction -EF 45-50% 6. DM -A1c 7.2    ROS: All other ROS reviewed and negative. Pertinent positives noted in the HPI.     Studies Reviewed: Marland Kitchen   EKG Interpretation Date/Time:  Monday December 23 2022 13:30:20 EST Ventricular Rate:  83 PR Interval:  166 QRS Duration:  94 QT Interval:  354 QTC Calculation: 415 R Axis:   -13  Text Interpretation: Normal sinus rhythm Normal ECG Confirmed by Lennie Odor [phone]) on 12/23/2022 1:48:44 PM    TTE 12/07/2021 1. Left ventricular ejection fraction, by estimation, is 45 to 50%. The  left ventricle has mildly decreased function. The left ventricle  demonstrates  global hypokinesis. The left ventricular internal cavity size  was mildly dilated. Left ventricular  diastolic parameters are consistent with Grade II diastolic dysfunction  (pseudonormalization). Elevated left ventricular end-diastolic pressure.   2. Right ventricular systolic function is normal. The right ventricular  size is normal.   3. Left atrial size was mildly dilated.   4. The mitral valve is normal in structure. Trivial mitral valve  regurgitation. No evidence of mitral stenosis.   5. The aortic valve is tricuspid. There is mild calcification of the  aortic valve. Aortic valve regurgitation is mild. No aortic stenosis is  present.   6. Aortic root was 4.3 cm 12/2019. Aortic dilatation noted. There is  moderate dilatation of the aortic root, measuring 44 mm. There is mild  dilatation of the ascending aorta, measuring 39 mm.   7. The inferior vena cava is normal in size with greater than 50%  respiratory variability, suggesting right atrial pressure of 3 mmHg.  Physical Exam:   VS:  BP (!) 140/89 (BP Location: Right Arm, Patient Position: Sitting, Cuff Size: Large)   Pulse 79   Ht 6\' 4"  (1.93 m)   Wt (!) 322 lb (146.1 kg)   SpO2 97%   BMI 39.20 kg/m    Wt Readings from Last 3 Encounters:  12/23/22 (!) 322 lb (146.1 kg)  11/19/21 (!) 324 lb 12.8 oz (147.3 kg)  07/07/20 (!) 330 lb (149.7 kg)    GEN: Well nourished, well developed in no acute distress NECK:  No JVD; No carotid bruits CARDIAC: RRR, no murmurs, rubs, gallops RESPIRATORY:  Clear to auscultation without rales, wheezing or rhonchi  ABDOMEN: Soft, non-tender, non-distended EXTREMITIES:  No edema; No deformity  ASSESSMENT AND PLAN: .   Assessment and Plan    Aortic Root Aneurysm Stable dilation of the aorta. No symptoms. Last CT scan in 2021 showed 45-21mm. -Schedule a CT scan in the next few weeks for updated measurement.  Hypertension Blood pressure slightly elevated today, but generally well-controlled. No  signs of volume overload. -Continue Metoprolol Succinate 100mg  daily and Losartan 25mg  daily. -Monitor blood pressure at home periodically.  Coronary Artery Disease (CAD) Single vessel obstructive CAD in the mid-LAD, being managed medically. No symptoms of chest pain or trouble breathing. -Continue Aspirin 81mg  daily and Lipitor 40mg  daily. -Return in one year or sooner if symptoms arise.   Subclinical LV dysfunction -LVEF 45-50%. No sx of HF. Continue metoprolol/losartan. On jardiance. No need for lasix.   Hyperlipidemia LDL cholesterol well-controlled at 58. -Continue Lipitor 40mg  daily.  Diabetes A1C at 7.2, close to goal. -Continue Jardiance 10mg  daily.  General Health Maintenance -Order blood test today for kidney profile. -Follow-up in one year or sooner if symptoms arise.              Follow-up: Return in about 1 year (around 12/23/2023).  Time Spent with Patient: I have spent a total of 35 minutes caring for this patient today face to face, ordering and reviewing labs/tests, reviewing prior records/medical history, examining the patient, establishing an assessment and plan, communicating results/findings to the patient/family, and documenting in the medical record.   Signed, Lenna Gilford. Flora Lipps, MD, [facility]  [facility]  [address] [place], [place] [address] [phone]  3:06 PM

## 2022-12-23 ENCOUNTER — Encounter: Payer: Self-pay | Admitting: Cardiovascular Disease

## 2022-12-23 ENCOUNTER — Ambulatory Visit: Payer: BC Managed Care – PPO | Attending: Cardiovascular Disease | Admitting: Cardiovascular Disease

## 2022-12-23 VITALS — BP 140/89 | HR 79 | Ht 76.0 in | Wt 322.0 lb

## 2022-12-23 DIAGNOSIS — I491 Atrial premature depolarization: Secondary | ICD-10-CM | POA: Diagnosis not present

## 2022-12-23 DIAGNOSIS — I519 Heart disease, unspecified: Secondary | ICD-10-CM | POA: Diagnosis not present

## 2022-12-23 DIAGNOSIS — I2510 Atherosclerotic heart disease of native coronary artery without angina pectoris: Secondary | ICD-10-CM

## 2022-12-23 DIAGNOSIS — Q2543 Congenital aneurysm of aorta: Secondary | ICD-10-CM | POA: Diagnosis not present

## 2022-12-23 NOTE — Patient Instructions (Addendum)
Medication Instructions:  Your physician recommends that you continue on your current medications as directed. Please refer to the Current Medication list given to you today.    *If you need a refill on your cardiac medications before your next appointment, please call your pharmacy*   Lab Work: BMET    If you have labs (blood work) drawn today and your tests are completely normal, you will receive your results only by: MyChart Message (if you have MyChart) OR A paper copy in the mail If you have any lab test that is abnormal or we need to change your treatment, we will call you to review the results.   Testing/Procedures: Non-Cardiac CT Angiography (CTA), is a special type of CT scan that uses a computer to produce multi-dimensional views of major blood vessels throughout the body. In CT angiography, a contrast material is injected through an IV to help visualize the blood vessels.    Follow-Up: At [facility], you and your health needs are our priority.  As part of our continuing mission to provide you with exceptional heart care, we have created designated Provider Care Teams.  These Care Teams include your primary Cardiologist (physician) and Advanced Practice Providers (APPs -  Physician Assistants and Nurse Practitioners) who all work together to provide you with the care you need, when you need it.  We recommend signing up for the patient portal called "MyChart".  Sign up information is provided on this After Visit Summary.  MyChart is used to connect with patients for Virtual Visits (Telemedicine).  Patients are able to view lab/test results, encounter notes, upcoming appointments, etc.  Non-urgent messages can be sent to your provider as well.   To learn more about what you can do with MyChart, go to [email or web address].    Your next appointment:   1 year(s)  The format for your next appointment:   In Person  Provider:   Reatha Harps, MD    Other  Instructions

## 2022-12-25 DIAGNOSIS — I491 Atrial premature depolarization: Secondary | ICD-10-CM | POA: Diagnosis not present

## 2022-12-26 LAB — BASIC METABOLIC PANEL
BUN/Creatinine Ratio: 15 (ref 10–24)
BUN: 17 mg/dL (ref 8–27)
CO2: 23 mmol/L (ref 20–29)
Calcium: 9.6 mg/dL (ref 8.6–10.2)
Chloride: 104 mmol/L (ref 96–106)
Creatinine, Ser: 1.11 mg/dL (ref 0.76–1.27)
Glucose: 205 mg/dL — ABNORMAL HIGH (ref 70–99)
Potassium: 4.8 mmol/L (ref 3.5–5.2)
Sodium: 142 mmol/L (ref 134–144)
eGFR: 73 mL/min/{1.73_m2} (ref >59)

## 2022-12-27 ENCOUNTER — Other Ambulatory Visit: Payer: Self-pay | Admitting: Cardiovascular Disease

## 2022-12-30 ENCOUNTER — Encounter: Payer: Self-pay | Admitting: Cardiovascular Disease

## 2022-12-31 ENCOUNTER — Ambulatory Visit (HOSPITAL_COMMUNITY): Payer: BC Managed Care – PPO

## 2023-01-01 ENCOUNTER — Telehealth: Payer: Self-pay | Admitting: Cardiovascular Disease

## 2023-01-01 NOTE — Telephone Encounter (Signed)
Called patient with no answer. Left message to return call to the office. Looking at last office note Metoprolol is ordered daily. New prescription was sent to the pharmacy 12/2 to reflect daily.

## 2023-01-01 NOTE — Telephone Encounter (Signed)
Pt c/o medication issue:  1. Name of Medication:   metoprolol succinate (TOPROL-XL) 100 MG 24 hr tablet    2. How are you currently taking this medication (dosage and times per day)? Take 1 tablet (100 mg total) by mouth daily.   3. Are you having a reaction (difficulty breathing--STAT)? No  4. What is your medication issue? Pt states the medication directions and dosage has changed and wants to know if this is accurate. Please advise

## 2023-01-02 MED ORDER — METOPROLOL SUCCINATE ER 100 MG PO TB24: 100.0000 mg | ORAL_TABLET | Freq: Two times a day (BID) | ORAL | 6 refills | Status: DC

## 2023-01-02 NOTE — Telephone Encounter (Signed)
Noted  Prescription updated and sent to pharmacy  Metoprolol Succinate 100mg  twice a day

## 2023-01-02 NOTE — Telephone Encounter (Signed)
Medication clarified per Dr Flora Macdonald.     Flora Macdonald, Ronnald Ramp, MD  to Gwenith Daily "Rocky Link"      01/01/23  9:14 PM Mr. Allor:   This must be an error. If you are taking this twice per day, please continue this. I will update the prescription.    Best,    Nicholas Spore T. Flora Lipps, MD, [facility]  [facility]  [address], [address] [place], [place] [address] [phone]  9:14 PM

## 2023-01-12 ENCOUNTER — Other Ambulatory Visit: Payer: Self-pay | Admitting: Cardiovascular Disease

## 2023-01-12 ENCOUNTER — Encounter: Payer: Self-pay | Admitting: Cardiovascular Disease

## 2023-01-12 DIAGNOSIS — I7121 Aneurysm of the ascending aorta, without rupture: Secondary | ICD-10-CM

## 2023-01-12 NOTE — Progress Notes (Signed)
Echo ordered for ascending aortic aneurysm. Insurance denied CTA.   Gerri Spore T. Flora Lipps, MD, [facility]  [facility]  [address], [address] [place], [place] [address] [phone]  6:34 PM

## 2023-01-14 ENCOUNTER — Ambulatory Visit (HOSPITAL_COMMUNITY): Payer: BC Managed Care – PPO

## 2023-01-20 NOTE — Telephone Encounter (Signed)
Called patient advised to call office to discuss email.I will send message to Dr.O'Neal's RN.

## 2023-02-06 ENCOUNTER — Telehealth: Payer: Self-pay

## 2023-02-06 ENCOUNTER — Telehealth (HOSPITAL_COMMUNITY): Payer: Self-pay | Admitting: Cardiovascular Disease

## 2023-02-06 NOTE — Telephone Encounter (Signed)
 Patient identification verified by 2 forms. Shade Flood, RN     Left dtvm informing patient to keep CT appt on 1/23 as it was approved by insurance and he and Dr. Flora Lipps agreed this would be the best form of imaging.

## 2023-02-06 NOTE — Telephone Encounter (Signed)
-----   Message from Nurse Buel HERO sent at 02/06/2023  1:39 PM EST ----- Regarding: FW: CT Aorta Gated  ----- Message ----- From: Leodis Laymon CROME Sent: 02/06/2023  12:55 PM EST To: Buel JAYSON Ege, RN Subject: RE: CT Aorta Gated                             Hey, he is now scheduled for 02/20/23. He stated he is supposed to have this done instead of the echo. I see he is schedule for that also. Can someone contact him about that. ----- Message ----- From: Ege Buel JAYSON, RN Sent: 02/06/2023  12:09 PM EST To: Laymon CROME Leodis; # Subject: CT Aorta Gated                                 Hello.  This patient needs to be re scheduled for a CT Aorta Gated.  I apologize if it has already been done or you are working on it; I wanted to make sure it was done.    Thank you, Buel, RN ([facility])

## 2023-02-06 NOTE — Telephone Encounter (Signed)
 Patient called and cancelled echocardiogram and states he no longer needs. Order will be removed from the echo WQ. Thank you.

## 2023-02-13 ENCOUNTER — Telehealth: Payer: Self-pay

## 2023-02-13 NOTE — Telephone Encounter (Signed)
Patient identification verified by 2 forms. Shade Flood, RN     Called patient but phone went straight to VM. Left dvm with date and time of CT scan.  I was unable to find documentation that patient was made aware of appt after it was scheduled.

## 2023-02-17 ENCOUNTER — Other Ambulatory Visit (HOSPITAL_COMMUNITY): Payer: BC Managed Care – PPO

## 2023-02-17 NOTE — Telephone Encounter (Signed)
Patient identification verified by 2 forms. Shade Flood, RN     Called patient but no answer. LVMTCB. Left message to Inform patient of CT appt on 1/23 at 11am

## 2023-02-19 NOTE — Telephone Encounter (Signed)
Patient identification verified by 2 forms. Shade Flood, RN     2nd attempt to contact pt regarding CT a scan. Phone going straight to vm.   Left detailed message with date and time

## 2023-02-20 ENCOUNTER — Ambulatory Visit (HOSPITAL_COMMUNITY)
Admission: RE | Admit: 2023-02-20 | Discharge: 2023-02-20 | Disposition: A | Discharge status: Home / Self Care | Payer: BC Managed Care – PPO | Source: Ambulatory Visit | Attending: Cardiovascular Disease | Admitting: Cardiovascular Disease

## 2023-02-20 DIAGNOSIS — I2510 Atherosclerotic heart disease of native coronary artery without angina pectoris: Secondary | ICD-10-CM | POA: Diagnosis not present

## 2023-02-20 DIAGNOSIS — I700 Atherosclerosis of aorta: Secondary | ICD-10-CM | POA: Diagnosis not present

## 2023-02-20 DIAGNOSIS — I712 Thoracic aortic aneurysm, without rupture, unspecified: Secondary | ICD-10-CM | POA: Diagnosis not present

## 2023-02-20 DIAGNOSIS — Q2543 Congenital aneurysm of aorta: Secondary | ICD-10-CM | POA: Diagnosis not present

## 2023-02-20 LAB — POCT I-STAT CREATININE: Creatinine, Ser: 1.2 mg/dL (ref 0.61–1.24)

## 2023-02-20 MED ORDER — IOHEXOL 350 MG/ML SOLN
75.0000 mL | Freq: Once | INTRAVENOUS | Status: AC | PRN
  Administered 2023-02-20: 75 mL via INTRAVENOUS

## 2023-02-24 NOTE — Telephone Encounter (Signed)
Patient completed CT scan on scheduled date and time. Results pending review

## 2023-02-25 DIAGNOSIS — E119 Type 2 diabetes mellitus without complications: Secondary | ICD-10-CM | POA: Diagnosis not present

## 2023-02-26 ENCOUNTER — Encounter: Payer: Self-pay | Admitting: Cardiovascular Disease

## 2023-05-19 DIAGNOSIS — L570 Actinic keratosis: Secondary | ICD-10-CM | POA: Diagnosis not present

## 2023-05-19 DIAGNOSIS — Z872 Personal history of diseases of the skin and subcutaneous tissue: Secondary | ICD-10-CM | POA: Diagnosis not present

## 2023-05-19 DIAGNOSIS — Z09 Encounter for follow-up examination after completed treatment for conditions other than malignant neoplasm: Secondary | ICD-10-CM | POA: Diagnosis not present

## 2023-05-19 DIAGNOSIS — D045 Carcinoma in situ of skin of trunk: Secondary | ICD-10-CM | POA: Diagnosis not present

## 2023-05-19 DIAGNOSIS — D225 Melanocytic nevi of trunk: Secondary | ICD-10-CM | POA: Diagnosis not present

## 2023-05-19 DIAGNOSIS — L821 Other seborrheic keratosis: Secondary | ICD-10-CM | POA: Diagnosis not present

## 2023-06-02 DIAGNOSIS — E119 Type 2 diabetes mellitus without complications: Secondary | ICD-10-CM | POA: Diagnosis not present

## 2023-06-18 ENCOUNTER — Ambulatory Visit: Admitting: Orthopaedic Surgery

## 2023-06-30 ENCOUNTER — Other Ambulatory Visit (INDEPENDENT_AMBULATORY_CARE_PROVIDER_SITE_OTHER)

## 2023-06-30 ENCOUNTER — Encounter: Payer: Self-pay | Admitting: Physician Assistant

## 2023-06-30 ENCOUNTER — Ambulatory Visit: Admitting: Physician Assistant

## 2023-06-30 VITALS — Ht 74.61 in | Wt 312.2 lb

## 2023-06-30 DIAGNOSIS — M25552 Pain in left hip: Secondary | ICD-10-CM | POA: Diagnosis not present

## 2023-06-30 NOTE — Progress Notes (Signed)
 Office Visit Note   Patient: Nicholas Macdonald           Date of Birth: 1956-01-25           MRN: [id number] Visit Date: 06/30/2023              Requested by: Margarete Sharps, MD [address] [place],  [place] [address] PCP: Margarete Sharps, MD   Assessment & Plan: Visit Diagnoses:  1. Pain in left hip     Plan: Discussed with him the radiographic findings.  Discussed intra-articular injection for the left hip arthritis.  However personally I feel this afford him at best minimal relief in regards to his daily activities that are being inhibited by the hip stiffness.  Also discussed with him total hip arthroplasty.  Surgical procedure discussed with patient at length.  Risk benefits of surgery discussed risk include but are not limited to blood vessel/nerve injury, DVT PE, wound healing problems, infection, blood loss and leg length discrepancy.  Postoperative protocol discussed with him.  He does live alone which suggest that he has someone at home least the first 24 hours.  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  He may consider surgery in the future.  He will follow-up with us  as needed.  Questions encouraged and answered handout on hip surgery was given.  Follow-Up Instructions: Return if symptoms worsen or fail to improve.   Orders:  Orders Placed This Encounter  Procedures   XR HIP UNILAT W OR W/O PELVIS 2-3 VIEWS LEFT   No orders of the defined types were placed in this encounter.     Procedures: No procedures performed   Clinical Data: No additional findings.   Subjective: Chief Complaint  Patient presents with   Left Hip - Pain    HPI Patient is 68 year old male were seen for the first time for chronic left hip pain has been ongoing for the past 6 months.  Ranks his pain 4 out of 10 at worst.  But he states he is unable to walk long distance due to the pain in the hip.  He has  difficulty donning shoes and socks and uses devices to don his socks.  He has also difficulty getting in and out of a car.  States that hip pain limits him in daily activities.  He has had no hip injury.  He has had no treatment for the hip pain including any medications.  He is diabetic reports his hemoglobin A1c runs between 6.8-7.1.  Denies any chest pain shortness of breath fevers chills.  Does live alone.  Review of Systems Denies chest pain shortness of breath fevers chills.  Objective: Vital Signs: Ht 6' 2.61" (1.895 m)   Wt (!) 312 lb 3.2 oz (141.6 kg)   BMI 39.44 kg/m   Physical Exam Constitutional:      Appearance: He is not ill-appearing or diaphoretic.  Pulmonary:     Effort: Pulmonary effort is normal.  Neurological:     Mental Status: He is alert and oriented to person, place, and time.  Psychiatric:        Mood and Affect: Mood normal.     Ortho Exam Bilateral hips: Right hip good external rotation slightly limited internal rotation but no pain.  Left hip virtually no internal or external rotation.  Calves are supple and nontender bilaterally.  Ambulates without any  assistive device.  Specialty Comments:  No specialty comments available.  Imaging: XR HIP UNILAT W OR W/O PELVIS 2-3 VIEWS LEFT Result Date: 06/30/2023 AP pelvis lateral view left hip: Bilateral hips well located.  Moderate arthritis right hip.  Severe end-stage arthritis left hip.  Periarticular osteophytes off the acetabulum of the left hip.  Misshapened left femoral head somewhat oblong.  Bone-on-bone left hip.  No acute fractures.  No bony lesions or signs of AVN.    PMFS History: Patient Active Problem List   Diagnosis Date Noted   Chest pain of uncertain etiology 05/07/2019   Palpitations 05/07/2019   Hypertension 02/05/2013   Hereditary and idiopathic peripheral neuropathy 11/10/2009   Migraine without aura 02/27/2009   GOUT 11/11/2006   HYPERGLYCEMIA 11/11/2006   NEPHROLITHIASIS, HX OF  11/11/2006   Past Medical History:  Diagnosis Date   Diabetes mellitus without complication (HCC)    Diverticulosis    Essential hypertension    Gout    HYPERGLYCEMIA 11/11/2006   Hyperlipidemia    MIGRAINE, COMMON 02/27/2009   NEPHROLITHIASIS, HX OF 11/11/2006   Overweight    PERIPHERAL NEUROPATHY 11/10/2009   Premature atrial contractions     Family History  Problem Relation Age of Onset   Atrial fibrillation Father        pacemaker   Heart disease Father        afib   Cardiomyopathy Father    Diabetes Paternal Grandmother    Sudden death Neg Hx     Past Surgical History:  Procedure Laterality Date   TONSILLECTOMY     TRACHEOSTOMY     croup   Social History   Occupational History   Not on file  Tobacco Use   Smoking status: Never   Smokeless tobacco: Never  Substance and Sexual Activity   Alcohol use: Not Currently   Drug use: Not Currently   Sexual activity: Not on file

## 2023-09-15 DIAGNOSIS — E119 Type 2 diabetes mellitus without complications: Secondary | ICD-10-CM | POA: Diagnosis not present

## 2023-09-18 ENCOUNTER — Other Ambulatory Visit: Payer: Self-pay | Admitting: Cardiovascular Disease

## 2023-09-19 ENCOUNTER — Other Ambulatory Visit: Payer: Self-pay

## 2023-09-19 MED ORDER — METOPROLOL SUCCINATE ER 100 MG PO TB24: 100.0000 mg | ORAL_TABLET | Freq: Two times a day (BID) | ORAL | 3 refills | Status: DC

## 2023-11-03 DIAGNOSIS — E785 Hyperlipidemia, unspecified: Secondary | ICD-10-CM | POA: Diagnosis not present

## 2023-11-03 DIAGNOSIS — Z125 Encounter for screening for malignant neoplasm of prostate: Secondary | ICD-10-CM | POA: Diagnosis not present

## 2023-11-03 DIAGNOSIS — M109 Gout, unspecified: Secondary | ICD-10-CM | POA: Diagnosis not present

## 2023-11-03 DIAGNOSIS — E119 Type 2 diabetes mellitus without complications: Secondary | ICD-10-CM | POA: Diagnosis not present

## 2023-11-03 DIAGNOSIS — Z0189 Encounter for other specified special examinations: Secondary | ICD-10-CM | POA: Diagnosis not present

## 2023-11-03 DIAGNOSIS — Z1212 Encounter for screening for malignant neoplasm of rectum: Secondary | ICD-10-CM | POA: Diagnosis not present

## 2023-11-10 DIAGNOSIS — E119 Type 2 diabetes mellitus without complications: Secondary | ICD-10-CM | POA: Diagnosis not present

## 2023-11-10 DIAGNOSIS — Z0000 Encounter for general adult medical examination without abnormal findings: Secondary | ICD-10-CM | POA: Diagnosis not present

## 2023-11-11 DIAGNOSIS — Z23 Encounter for immunization: Secondary | ICD-10-CM | POA: Diagnosis not present

## 2023-11-11 DIAGNOSIS — Z1389 Encounter for screening for other disorder: Secondary | ICD-10-CM | POA: Diagnosis not present

## 2023-11-11 DIAGNOSIS — Z1331 Encounter for screening for depression: Secondary | ICD-10-CM | POA: Diagnosis not present

## 2023-12-01 ENCOUNTER — Encounter: Payer: Self-pay | Admitting: Radiology

## 2023-12-26 ENCOUNTER — Other Ambulatory Visit: Payer: Self-pay | Admitting: Cardiovascular Disease

## 2023-12-29 ENCOUNTER — Ambulatory Visit: Admitting: Cardiovascular Disease

## 2024-01-01 ENCOUNTER — Other Ambulatory Visit: Payer: Self-pay | Admitting: Cardiovascular Disease

## 2024-01-07 NOTE — Progress Notes (Addendum)
 " Cardiology Office Note:    Date:  01/12/2024   ID:  Nicholas, Macdonald 1955/04/26, MRN [id number]  PCP:  Nicholas Rush, MD   [facility] Providers Cardiologist:  Nicholas ONEIDA Decent, MD     Referring MD: Nicholas Rush, MD   Chief Complaint  Patient presents with   Follow-up    CM, preop    History of Present Illness:    Nicholas Macdonald is a 68 y.o. male with a hx of CAD, aortic root aneurysm, PACs, and mild cardiomyopathy.   He underwent CT coronary 05/2019 that demonstrated 70-99% mid LAD stenosis with a CT FFR of 0.68.  Hyperlipidemia has been well-controlled with last LDL of 58.  He is being followed for aortic root aneurysm with annual echocardiograms.  Echocardiogram showed an LVEF 45-50%.  He does have DM with an A1c 7.2%.  He presents for annual follow up but also preoperative risk evaluation prior to hip replacement.   He continues to work as a psychologist, sport and exercise, works in education officer, environmental. 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 His mobility is limited by his hip. However, he has a second story in his home and climbs steps. He is not walking much due to hip pain but previously could walk half mile. He is able to grocery shop and carries groceries in from car as well as moderate house work.   He denies chest pain, SOB, orthopnea. Palpitations are controlled. Per patient, he has stable mild LE edema, but appears to be 2+ on exam.    Past Medical History:  Diagnosis Date   Diabetes mellitus without complication (HCC)    Diverticulosis    Essential hypertension    Gout    HYPERGLYCEMIA 11/11/2006   Hyperlipidemia    MIGRAINE, COMMON 02/27/2009   NEPHROLITHIASIS, HX OF 11/11/2006   Overweight    PERIPHERAL NEUROPATHY 11/10/2009   Premature atrial contractions     Past Surgical History:  Procedure Laterality Date   TONSILLECTOMY     TRACHEOSTOMY     croup    Current Medications: Current Meds   Medication Sig   allopurinol  (ZYLOPRIM ) 300 MG tablet TAKE ONE TABLET BY MOUTH ONCE DAILY   aspirin  EC 81 MG tablet Take 1 tablet (81 mg total) by mouth daily.   atorvastatin  (LIPITOR) 40 MG tablet Take 1 tablet (40 mg total) by mouth daily. MUST MAKE APPT FOR FURTHER REFILLS. 1st attempt   JARDIANCE 10 MG TABS tablet Take 10 mg by mouth daily.   losartan  (COZAAR ) 25 MG tablet Take 1 tablet (25 mg total) by mouth daily.   metFORMIN (GLUCOPHAGE) 500 MG tablet Take 500 mg by mouth daily.   metoprolol  succinate (TOPROL -XL) 100 MG 24 hr tablet Take 1 tablet (100 mg total) by mouth in the morning and at bedtime.   nitroGLYCERIN  (NITROSTAT ) 0.4 MG SL tablet Place 1 tablet (0.4 mg total) under the tongue every 5 (five) minutes as needed for chest pain.   OZEMPIC, 1 MG/DOSE, 2 MG/1.5ML SOPN Inject 1 mg into the skin once a week. Thursday     Allergies:   Patient has no known allergies.   Social History   Socioeconomic History   Marital status: Divorced    Spouse name: Not on file   Number of children: 2   Years of education: Not on file   Highest education level: Not on file  Occupational History   Not on file  Tobacco Use   Smoking status: Never   Smokeless tobacco: Never  Substance and Sexual Activity   Alcohol  use: Not Currently   Drug use: Not Currently   Sexual activity: Not on file  Other Topics Concern   Not on file  Social History Narrative   L[facility] in [facility]   He works as a BUILDING SERVICES ENGINEER for [employer]   Owns an radio producer company   Social Drivers of Health   Tobacco Use: Low Risk (01/12/2024)   Patient History    Smoking Tobacco Use: Never    Smokeless Tobacco Use: Never    Passive Exposure: Not on file  Financial Resource Strain: Not on file  Food Insecurity: Not on file  Transportation Needs: Not on file  Physical Activity: Not on file  Stress: Not on file  Social Connections: Not on file  Depression (EYV7-0): Not on file   Alcohol  Screen: Not on file  Housing: Not on file  Utilities: Not on file  Health Literacy: Not on file     Family History: The patient's family history includes Atrial fibrillation in his father; Cardiomyopathy in his father; Diabetes in his paternal grandmother; Heart disease in his father. There is no history of Sudden death.  ROS:   Please see the history of present illness.     All other systems reviewed and are negative.  EKGs/Labs/Other Studies Reviewed:    The following studies were reviewed today:  EKG Interpretation Date/Time:  Monday January 12 2024 09:59:22 EST Ventricular Rate:  86 PR Interval:  160 QRS Duration:  94 QT Interval:  352 QTC Calculation: 421 R Axis:   7  Text Interpretation: Normal sinus rhythm Normal ECG When compared with ECG of 23-Dec-2022 13:30, No significant change was found Confirmed by Nicholas Macdonald ([id number]) on 01/12/2024 10:06:36 AM    Recent Labs: 02/20/2023: Creatinine, Ser 1.20  Recent Lipid Panel    Component Value Date/Time   CHOL 108 07/30/2019 0934   TRIG 117 07/30/2019 0934   HDL 42 07/30/2019 0934   CHOLHDL 2.6 07/30/2019 0934   CHOLHDL 4 02/28/2012 1027   VLDL 35.2 02/28/2012 1027   LDLCALC 45 07/30/2019 0934   LDLDIRECT 121.5 07/03/2009 0924     Risk Assessment/Calculations:                Physical Exam:    VS:  BP 126/80 (Cuff Size: Large)   Pulse 86   Ht 6' 3 (1.905 m)   Wt (!) 310 lb (140.6 kg)   SpO2 96%   BMI 38.75 kg/m     Wt Readings from Last 3 Encounters:  01/12/24 (!) 310 lb (140.6 kg)  06/30/23 (!) 312 lb 3.2 oz (141.6 kg)  12/23/22 (!) 322 lb (146.1 kg)     GEN: obese male in NAD HEENT: Normal NECK: No JVD; No carotid bruits LYMPHATICS: No lymphadenopathy CARDIAC: RRR, no murmurs, rubs, gallops RESPIRATORY:  Clear to auscultation without rales, wheezing or rhonchi  ABDOMEN: Soft, non-tender, non-distended MUSCULOSKELETAL:  2+ B pitting edema with skin changes/erythema SKIN: Warm and  dry NEUROLOGIC:  Alert and oriented x 3 PSYCHIATRIC:  Normal affect   ASSESSMENT:    1. Screening for cardiovascular condition   2. Acute on chronic combined systolic and diastolic CHF (congestive heart failure) (HCC)   3. Lower extremity edema   4. Aortic root aneurysm   5. Coronary artery disease involving native coronary artery of native heart without angina pectoris  6. Palpitations   7. Primary hypertension   8. Hyperlipidemia with target LDL less than 70   9. Preoperative cardiovascular examination    PLAN:    In order of problems listed above:  Lower extremity edema Mild cardiomyopathy Chronic systolic and diastolic heart failure Hypertension - LVEF 45-50% , grade 2 DD - GDMT: Continue 10 mg Jardiance, 25 mg losartan , 100 mg Toprol  daily -- he has lower extremity edema and I'm concerned about hypervolemia -- he denies SOB, but is also more sedentary given hip pain -- given 25 mg losartan , we could potentially switch to entresto if his echo shows persistently reduced LVEF -- will obtain repeat echo,  no medication changes for now -- if LVEF reduced, will switch losartan  to 24-26 mg entresto with BMP in 1 week   CAD - One-vessel CAD in the mid LAD noted on CCTA - No chest pain - Continue risk factor modification - Continue aspirin  81 mg, 40 mg atorvastatin  -- more sedentary with hip pain, continue risk factor modification   Hyperlipidemia with LDL less than 70 - Continue 40 mg Lipitor - Needs updated lipid panel - will call PCP office to get updated labs -- if unable to get, would draw lipids and LPA    Aortic root dilation - CTA measure similar to echo readings, repeat echo 01/2024 - Maintain BP control   DM - A1c 7.1% - not on insulin   Preoperative risk evaluation prior to hip surgery Despite hip pain, he is able to complete 4.0 METS. He has a history of CHF and CAD but without ischemia. He has a 0.9% risk of MACE for any procedure. Will obtain echo  to ensure LVEF and aortic root are stable.  Suspect he will be able to proceed with hip surgery.    Follow up in 6 months.    ADDENDUM: Echo demonstrated stable LVEF 45-50%. Ascending aortic dilation has increased from 39 mm to 42 mm and the aortic root has increased from 44 mm to 45 mm. He may proceed with surgery.        Medication Adjustments/Labs and Tests Ordered: Current medicines are reviewed at length with the patient today.  Concerns regarding medicines are outlined above.  Orders Placed This Encounter  Procedures   EKG 12-Lead   ECHOCARDIOGRAM COMPLETE   No orders of the defined types were placed in this encounter.   Patient Instructions  Medication Instructions:  Your physician recommends that you continue on your current medications as directed. Please refer to the Current Medication list given to you today.  *If you need a refill on your cardiac medications before your next appointment, please call your pharmacy*  Lab Work: NONE ORDERED If you have labs (blood work) drawn today and your tests are completely normal, you will receive your results only by: MyChart Message (if you have MyChart) OR A paper copy in the mail If you have any lab test that is abnormal or we need to change your treatment, we will call you to review the results.  Testing/Procedures: Your physician has requested that you have an echocardiogram. Echocardiography is a painless test that uses sound waves to create images of your heart. It provides your doctor with information about the size and shape of your heart and how well your hearts chambers and valves are working. This procedure takes approximately one hour. There are no restrictions for this procedure. Please do NOT wear cologne, perfume, aftershave, or lotions (deodorant is allowed). Please arrive 15 minutes  prior to your appointment time.  Please note: We ask at that you not bring children with you during ultrasound (echo/ vascular)  testing. Due to room size and safety concerns, children are not allowed in the ultrasound rooms during exams. Our front office staff cannot provide observation of children in our lobby area while testing is being conducted. An adult accompanying a patient to their appointment will only be allowed in the ultrasound room at the discretion of the ultrasound technician under special circumstances. We apologize for any inconvenience.   Follow-Up: At [facility], you and your health needs are our priority.  As part of our continuing mission to provide you with exceptional heart care, our providers are all part of one team.  This team includes your primary Cardiologist (physician) and Advanced Practice Providers or APPs (Physician Assistants and Nurse Practitioners) who all work together to provide you with the care you need, when you need it.  Your next appointment:   6 month(s)  Provider:   Darryle ONEIDA Decent, MD    We recommend signing up for the patient portal called MyChart.  Sign up information is provided on this After Visit Summary.  MyChart is used to connect with patients for Virtual Visits (Telemedicine).  Patients are able to view lab/test results, encounter notes, upcoming appointments, etc.  Non-urgent messages can be sent to your provider as well.   To learn more about what you can do with MyChart, go to [email or web address].              Signed, Jon Nat Hails, PA  01/12/2024 11:17 AM    [facility] "

## 2024-01-12 ENCOUNTER — Ambulatory Visit: Attending: Physician Assistant | Admitting: Physician Assistant

## 2024-01-12 ENCOUNTER — Encounter: Payer: Self-pay | Admitting: Physician Assistant

## 2024-01-12 VITALS — BP 126/80 | HR 86 | Ht 75.0 in | Wt 310.0 lb

## 2024-01-12 DIAGNOSIS — Z136 Encounter for screening for cardiovascular disorders: Secondary | ICD-10-CM

## 2024-01-12 DIAGNOSIS — R002 Palpitations: Secondary | ICD-10-CM | POA: Diagnosis not present

## 2024-01-12 DIAGNOSIS — Q2543 Congenital aneurysm of aorta: Secondary | ICD-10-CM

## 2024-01-12 DIAGNOSIS — I10 Essential (primary) hypertension: Secondary | ICD-10-CM

## 2024-01-12 DIAGNOSIS — E785 Hyperlipidemia, unspecified: Secondary | ICD-10-CM | POA: Diagnosis not present

## 2024-01-12 DIAGNOSIS — I2510 Atherosclerotic heart disease of native coronary artery without angina pectoris: Secondary | ICD-10-CM | POA: Diagnosis not present

## 2024-01-12 DIAGNOSIS — Z01810 Encounter for preprocedural cardiovascular examination: Secondary | ICD-10-CM | POA: Diagnosis not present

## 2024-01-12 DIAGNOSIS — R600 Localized edema: Secondary | ICD-10-CM

## 2024-01-12 DIAGNOSIS — I5043 Acute on chronic combined systolic (congestive) and diastolic (congestive) heart failure: Secondary | ICD-10-CM | POA: Diagnosis not present

## 2024-01-12 NOTE — Patient Instructions (Signed)
 Medication Instructions:  Your physician recommends that you continue on your current medications as directed. Please refer to the Current Medication list given to you today.  *If you need a refill on your cardiac medications before your next appointment, please call your pharmacy*  Lab Work: NONE ORDERED If you have labs (blood work) drawn today and your tests are completely normal, you will receive your results only by: MyChart Message (if you have MyChart) OR A paper copy in the mail If you have any lab test that is abnormal or we need to change your treatment, we will call you to review the results.  Testing/Procedures: Your physician has requested that you have an echocardiogram. Echocardiography is a painless test that uses sound waves to create images of your heart. It provides your doctor with information about the size and shape of your heart and how well your hearts chambers and valves are working. This procedure takes approximately one hour. There are no restrictions for this procedure. Please do NOT wear cologne, perfume, aftershave, or lotions (deodorant is allowed). Please arrive 15 minutes prior to your appointment time.  Please note: We ask at that you not bring children with you during ultrasound (echo/ vascular) testing. Due to room size and safety concerns, children are not allowed in the ultrasound rooms during exams. Our front office staff cannot provide observation of children in our lobby area while testing is being conducted. An adult accompanying a patient to their appointment will only be allowed in the ultrasound room at the discretion of the ultrasound technician under special circumstances. We apologize for any inconvenience.   Follow-Up: At [facility], you and your health needs are our priority.  As part of our continuing mission to provide you with exceptional heart care, our providers are all part of one team.  This team includes your primary  Cardiologist (physician) and Advanced Practice Providers or APPs (Physician Assistants and Nurse Practitioners) who all work together to provide you with the care you need, when you need it.  Your next appointment:   6 month(s)  Provider:   Darryle ONEIDA Decent, MD    We recommend signing up for the patient portal called MyChart.  Sign up information is provided on this After Visit Summary.  MyChart is used to connect with patients for Virtual Visits (Telemedicine).  Patients are able to view lab/test results, encounter notes, upcoming appointments, etc.  Non-urgent messages can be sent to your provider as well.   To learn more about what you can do with MyChart, go to [email or web address].

## 2024-01-19 ENCOUNTER — Other Ambulatory Visit: Payer: Self-pay | Admitting: Cardiovascular Disease

## 2024-01-20 NOTE — Telephone Encounter (Signed)
 PT calling to check status because he is out of medication

## 2024-02-20 ENCOUNTER — Ambulatory Visit (HOSPITAL_COMMUNITY)
Admission: RE | Admit: 2024-02-20 | Discharge: 2024-02-20 | Disposition: A | Source: Ambulatory Visit | Attending: Physician Assistant

## 2024-02-20 DIAGNOSIS — R600 Localized edema: Secondary | ICD-10-CM | POA: Insufficient documentation

## 2024-02-20 DIAGNOSIS — I5043 Acute on chronic combined systolic (congestive) and diastolic (congestive) heart failure: Secondary | ICD-10-CM | POA: Diagnosis present

## 2024-02-20 MED ORDER — PERFLUTREN LIPID MICROSPHERE
1.0000 mL | INTRAVENOUS | Status: AC | PRN
  Administered 2024-02-20: 3 mL via INTRAVENOUS

## 2024-02-22 LAB — ECHOCARDIOGRAM COMPLETE
Area-P 1/2: 3.31 cm2
Calc EF: 37.7 %
MV M vel: 4.79 m/s
MV Peak grad: 91.8 mmHg
P 1/2 time: 332 ms
S' Lateral: 5.4 cm
Single Plane A2C EF: 38 %
Single Plane A4C EF: 43.3 %

## 2024-02-23 ENCOUNTER — Ambulatory Visit: Payer: Self-pay | Admitting: Physician Assistant

## 2024-03-04 ENCOUNTER — Ambulatory Visit: Admitting: Orthopaedic Surgery

## 2024-03-04 ENCOUNTER — Encounter: Payer: Self-pay | Admitting: Orthopaedic Surgery

## 2024-03-04 VITALS — Ht 75.0 in | Wt 306.0 lb

## 2024-03-04 DIAGNOSIS — M1612 Unilateral primary osteoarthritis, left hip: Secondary | ICD-10-CM | POA: Insufficient documentation

## 2024-03-04 DIAGNOSIS — M25552 Pain in left hip: Secondary | ICD-10-CM

## 2024-03-04 NOTE — Progress Notes (Signed)
 The patient is a 69 year old gentleman we last saw about 8 months ago as a relates to left hip pain that had been going on for 6 months to a year.  This was left hip and groin pain.  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  He does ambulate with a cane.  He is a diabetic but reports a good hemoglobin A1c.  His BMI is down to 38.25.  He is a regular patient of Dr. Norleen Jungling of [facility].  He says at this point his hip pain is daily and is detrimentally affecting his mobility, his quality of life and his actives day living to the point he does wish to proceed with a hip replacement.  He has tried and failed all forms conservative treatment.  On exam his right hip moves smoothly.  His left hip is very painful with stiffness and the inability to completely rotate the left hip and severe pain in the groin.  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  There are sclerotic changes, cystic changes and osteophytes around the left hip.  We had a long and thorough discussion about hip replacement surgery and discussed the risk and benefits of the surgery and what to expect from an intraoperative and postoperative standpoint.  He does live alone and we will eventually need to discuss the logistics of postoperative care and what may be needed for him for transitioning back to home.  I did show him a hip replacement model and he has a handout about hip replacement surgery.  We discussed the risk and benefits of surgery as well.  All questions and concerns were answered and addressed.  Will work on getting this scheduled in the near future.  He is seeing an endodontist soon about a mouth issue but this may not have any impact on surgery at  all.
